# Patient Record
Sex: Female | Born: 1995 | Race: Black or African American | Hispanic: No | Marital: Single | Smoking: Former smoker
Health system: Southern US, Community
[De-identification: ages and names within clinical notes are randomized; demographics above are authoritative.]

## PROBLEM LIST (undated history)

## (undated) DIAGNOSIS — Z789 Other specified health status: Secondary | ICD-10-CM

## (undated) HISTORY — DX: Other specified health status: Z78.9

## (undated) HISTORY — PX: TOOTH EXTRACTION: SUR596

## (undated) HISTORY — PX: NO PAST SURGERIES: SHX2092

---
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
## 2016-08-22 ENCOUNTER — Emergency Department (HOSPITAL_COMMUNITY)
Admission: EM | Admit: 2016-08-22 | Discharge: 2016-08-22 | Disposition: A | Discharge status: Home / Self Care | Payer: Medicaid Other | Attending: Emergency Medicine | Admitting: Emergency Medicine

## 2016-08-22 ENCOUNTER — Encounter (HOSPITAL_COMMUNITY): Payer: Self-pay | Admitting: Emergency Medicine

## 2016-08-22 DIAGNOSIS — F17200 Nicotine dependence, unspecified, uncomplicated: Secondary | ICD-10-CM | POA: Insufficient documentation

## 2016-08-22 DIAGNOSIS — B3731 Acute candidiasis of vulva and vagina: Secondary | ICD-10-CM

## 2016-08-22 DIAGNOSIS — B373 Candidiasis of vulva and vagina: Secondary | ICD-10-CM | POA: Insufficient documentation

## 2016-08-22 DIAGNOSIS — Z79899 Other long term (current) drug therapy: Secondary | ICD-10-CM | POA: Insufficient documentation

## 2016-08-22 LAB — URINALYSIS, ROUTINE W REFLEX MICROSCOPIC
BILIRUBIN URINE: NEGATIVE
GLUCOSE, UA: NEGATIVE mg/dL
HGB URINE DIPSTICK: NEGATIVE
KETONES UR: NEGATIVE mg/dL
Nitrite: NEGATIVE
PH: 6 (ref 5.0–8.0)
Protein, ur: NEGATIVE mg/dL
SPECIFIC GRAVITY, URINE: 1.022 (ref 1.005–1.030)

## 2016-08-22 LAB — URINE MICROSCOPIC-ADD ON

## 2016-08-22 LAB — WET PREP, GENITAL
Clue Cells Wet Prep HPF POC: NONE SEEN
SPERM: NONE SEEN
TRICH WET PREP: NONE SEEN

## 2016-08-22 LAB — POC URINE PREG, ED: PREG TEST UR: NEGATIVE

## 2016-08-22 MED ORDER — FLUCONAZOLE 150 MG PO TABS: 150.0000 mg | ORAL_TABLET | Freq: Once | ORAL | 1 refills | Status: AC

## 2016-08-22 NOTE — ED Provider Notes (Signed)
[facility]-EMERGENCY DEPT Provider Note   CSN: [id number] Arrival date & time: 08/22/16  1840     History   Chief Complaint Chief Complaint  Patient presents with  . Vaginal Itching    HPI Leah Rivera is a 20 y.o. female.  The history is provided by the patient. No language interpreter was used.  Vaginal Itching     Leah Rivera is a 20 y.o. female who presents to the Emergency Department complaining of vaginal itching.  She reports one day of increased vaginal itching and slight increased vaginal discharge. She denies any dysuria or abdominal pain. No fevers, nausea, vomiting. No new sexual partners and no recent similar symptoms.  History reviewed. No pertinent past medical history.  Patient Active Problem List   Diagnosis Date Noted  . Vaginal discharge 02/02/2014  . Encounter for counseling regarding contraception 02/02/2014    History reviewed. No pertinent surgical history.  OB History    No data available       Home Medications    Prior to Admission medications   Medication Sig Start Date End Date Taking? Authorizing Provider  azithromycin (ZITHROMAX) 500 MG tablet Take 2 tablets (1,000 mg total) by mouth daily. 02/04/14   Reuben Likesavid C Keller, MD  hydrocortisone cream 0.5 % Apply 1 application topically 2 (two) times daily.    Historical Provider, MD  metroNIDAZOLE (FLAGYL) 500 MG tablet Take 1 tablet (500 mg total) by mouth 2 (two) times daily. 02/04/14   Reuben Likesavid C Keller, MD  norgestimate-ethinyl estradiol (SPRINTEC 28) 0.25-35 MG-MCG tablet Take 1 tablet by mouth daily. 02/02/14   Garnetta BuddyEdward V Williamson, MD  permethrin (ELIMITE) 5 % cream Apply to affected area once and leave on for 8-10 days then wash off.  Repeat in 7-10 days qs 02/19/13   Marcellina Millinimothy Galey, MD    Family History No family history on file.  Social History Social History  Substance Use Topics  . Smoking status: Current Every Day Smoker    Packs/day: 2.00  . Smokeless tobacco: Never Used  . Alcohol use  Yes     Allergies   Patient has no known allergies.   Review of Systems Review of Systems  All other systems reviewed and are negative.    Physical Exam Updated Vital Signs BP 122/67 (BP Location: Left Arm)   Pulse 64   Temp 98.6 F (37 C) (Oral)   Resp 19   SpO2 99%   Physical Exam  Constitutional: She is oriented to person, place, and time. She appears well-developed and well-nourished.  HENT:  Head: Normocephalic and atraumatic.  Cardiovascular: Normal rate and regular rhythm.   Pulmonary/Chest: Effort normal. No respiratory distress.  Genitourinary:  Genitourinary Comments: Moderate, thick white curd-like discharge. No CMT or adnexal tenderness. Os closed.  Musculoskeletal: Normal range of motion.  Neurological: She is alert and oriented to person, place, and time.  Skin: Skin is warm.  Psychiatric: She has a normal mood and affect.  Nursing note and vitals reviewed.    ED Treatments / Results  Labs (all labs ordered are listed, but only abnormal results are displayed) Labs Reviewed  WET PREP, GENITAL - Abnormal; Notable for the following:       Result Value   Yeast Wet Prep HPF POC PRESENT (*)    WBC, Wet Prep HPF POC FEW (*)    All other components within normal limits  URINALYSIS, ROUTINE W REFLEX MICROSCOPIC (NOT AT [facility]) - Abnormal; Notable for the following:    APPearance CLOUDY (*)  Leukocytes, UA MODERATE (*)    All other components within normal limits  URINE MICROSCOPIC-ADD ON - Abnormal; Notable for the following:    Squamous Epithelial / LPF 6-30 (*)    Bacteria, UA FEW (*)    All other components within normal limits  RPR  HIV ANTIBODY (ROUTINE TESTING)  POC URINE PREG, ED  GC/CHLAMYDIA PROBE AMP ([place]) NOT AT [facility]    EKG  EKG Interpretation None       Radiology No results found.  Procedures Procedures (including critical care time)  Medications Ordered in ED Medications - No data to display   Initial Impression  / Assessment and Plan / ED Course  I have reviewed the triage vital signs and the nursing notes.  Pertinent labs & imaging results that were available during my care of the patient were reviewed by me and considered in my medical decision making (see chart for details).  Clinical Course     Patient here for vaginal itching. Examination is consistent with vulvovaginal candidiasis. Will treat with Diflucan. Exam is not consistent with PID or cervicitis. Discussed him care for yeast infection, outpatient follow-up and return precautions.  Final Clinical Impressions(s) / ED Diagnoses   Final diagnoses:  Vaginal yeast infection    New Prescriptions Discharge Medication List as of 08/22/2016  9:33 PM    START taking these medications   Details  fluconazole (DIFLUCAN) 150 MG tablet Take 1 tablet (150 mg total) by mouth once. Take one tablet.  You may take a second tablet in 72 hours if you have continued symptoms., Starting Sun 08/22/2016, Print         Tilden FossaElizabeth Bibiana Gillean, MD 08/23/16 [phone]

## 2016-08-22 NOTE — ED Notes (Signed)
Patient denies pain and is resting comfortably.  

## 2016-08-22 NOTE — ED Triage Notes (Signed)
Pt has changed body soaps a few days ago, itching started at that time.

## 2016-08-22 NOTE — ED Notes (Signed)
Pelvic supplies at bedside. 

## 2016-08-22 NOTE — ED Triage Notes (Signed)
Patient c/o vaginal itching that started yesterday. patient denies discharge, odors or urination problems.

## 2016-08-23 LAB — GC/CHLAMYDIA PROBE AMP (~~LOC~~) NOT AT ARMC
Chlamydia: NEGATIVE
NEISSERIA GONORRHEA: NEGATIVE

## 2016-08-24 LAB — RPR: RPR Ser Ql: NONREACTIVE

## 2016-08-24 LAB — HIV ANTIBODY (ROUTINE TESTING W REFLEX): HIV Screen 4th Generation wRfx: NONREACTIVE

## 2016-09-16 ENCOUNTER — Encounter (HOSPITAL_COMMUNITY): Payer: Self-pay | Admitting: Emergency Medicine

## 2016-09-16 ENCOUNTER — Emergency Department (HOSPITAL_COMMUNITY): Payer: Self-pay

## 2016-09-16 ENCOUNTER — Emergency Department (HOSPITAL_COMMUNITY)
Admission: EM | Admit: 2016-09-16 | Discharge: 2016-09-17 | Disposition: A | Discharge status: Home / Self Care | Payer: Self-pay | Attending: Emergency Medicine | Admitting: Emergency Medicine

## 2016-09-16 DIAGNOSIS — R0789 Other chest pain: Secondary | ICD-10-CM | POA: Insufficient documentation

## 2016-09-16 DIAGNOSIS — F17200 Nicotine dependence, unspecified, uncomplicated: Secondary | ICD-10-CM | POA: Insufficient documentation

## 2016-09-16 LAB — CBC
HCT: 33.9 % — ABNORMAL LOW (ref 36.0–46.0)
Hemoglobin: 11 g/dL — ABNORMAL LOW (ref 12.0–15.0)
MCH: 27 pg (ref 26.0–34.0)
MCHC: 32.4 g/dL (ref 30.0–36.0)
MCV: 83.1 fL (ref 78.0–100.0)
PLATELETS: 223 10*3/uL (ref 150–400)
RBC: 4.08 MIL/uL (ref 3.87–5.11)
RDW: 12.9 % (ref 11.5–15.5)
WBC: 5.2 10*3/uL (ref 4.0–10.5)

## 2016-09-16 LAB — BASIC METABOLIC PANEL
Anion gap: 8 (ref 5–15)
BUN: 10 mg/dL (ref 6–20)
CALCIUM: 9.6 mg/dL (ref 8.9–10.3)
CO2: 24 mmol/L (ref 22–32)
CREATININE: 0.72 mg/dL (ref 0.44–1.00)
Chloride: 108 mmol/L (ref 101–111)
GFR calc Af Amer: >60 mL/min (ref >60)
GLUCOSE: 92 mg/dL (ref 65–99)
Potassium: 3.4 mmol/L — ABNORMAL LOW (ref 3.5–5.1)
Sodium: 140 mmol/L (ref 135–145)

## 2016-09-16 LAB — I-STAT TROPONIN, ED: TROPONIN I, POC: 0 ng/mL (ref 0.00–0.08)

## 2016-09-16 MED ORDER — KETOROLAC TROMETHAMINE 30 MG/ML IJ SOLN
15.0000 mg | Freq: Once | INTRAMUSCULAR | Status: AC
  Administered 2016-09-16: 15 mg via INTRAMUSCULAR
  Filled 2016-09-16: qty 1

## 2016-09-16 MED ORDER — GI COCKTAIL ~~LOC~~
30.0000 mL | Freq: Once | ORAL | Status: AC
  Administered 2016-09-16: 30 mL via ORAL
  Filled 2016-09-16: qty 30

## 2016-09-16 MED ORDER — PREDNISONE 20 MG PO TABS
60.0000 mg | ORAL_TABLET | ORAL | Status: AC
  Administered 2016-09-16: 60 mg via ORAL
  Filled 2016-09-16: qty 3

## 2016-09-16 MED ORDER — OMEPRAZOLE 20 MG PO CPDR: 20.0000 mg | DELAYED_RELEASE_CAPSULE | Freq: Every day | ORAL | 0 refills | Status: DC

## 2016-09-16 MED ORDER — GI COCKTAIL ~~LOC~~: 30.0000 mL | Freq: Two times a day (BID) | ORAL | 0 refills | Status: AC | PRN

## 2016-09-16 NOTE — ED Provider Notes (Signed)
[facility]-EMERGENCY DEPT Provider Note   CSN: [id number] Arrival date & time: 09/16/16  2000     History   Chief Complaint Chief Complaint  Patient presents with  . Chest Pain    HPI Leah Rivera is a 20 y.o. female.  HPI Patient presents with chest pain. The pain began earlier today, was mild, but over the past 3 hours has become severe. The pain is focally in the sternum, throat, burning, sharp. There is associated nausea, but no vomiting. No dyspnea. Patient is generally well, denies medical problems, but does state that she has muscle soreness for which she has been taking between 600 and 1200 mg of ibuprofen daily for the past few days. Since this episode of pain began no medication taken for relief, no clear alleviating or exacerbating factors.    History reviewed. No pertinent past medical history.  Patient Active Problem List   Diagnosis Date Noted  . Vaginal discharge 02/02/2014  . Encounter for counseling regarding contraception 02/02/2014    History reviewed. No pertinent surgical history.  OB History    No data available       Home Medications    Prior to Admission medications   Medication Sig Start Date End Date Taking? Authorizing Provider  ibuprofen (ADVIL,MOTRIN) 600 MG tablet Take 600 mg by mouth every 6 (six) hours as needed for moderate pain.   Yes Historical Provider, MD  azithromycin (ZITHROMAX) 500 MG tablet Take 2 tablets (1,000 mg total) by mouth daily. Patient not taking: Reported on 09/16/2016 02/04/14   Reuben Likesavid C Keller, MD  metroNIDAZOLE (FLAGYL) 500 MG tablet Take 1 tablet (500 mg total) by mouth 2 (two) times daily. Patient not taking: Reported on 09/16/2016 02/04/14   Reuben Likesavid C Keller, MD  norgestimate-ethinyl estradiol (SPRINTEC 28) 0.25-35 MG-MCG tablet Take 1 tablet by mouth daily. Patient not taking: Reported on 09/16/2016 02/02/14   Garnetta BuddyEdward V Williamson, MD  permethrin (ELIMITE) 5 % cream Apply to affected area once and leave on for  8-10 days then wash off.  Repeat in 7-10 days qs Patient not taking: Reported on 09/16/2016 02/19/13   Marcellina Millinimothy Galey, MD    Family History History reviewed. No pertinent family history.  Social History Social History  Substance Use Topics  . Smoking status: Current Every Day Smoker    Packs/day: 2.00  . Smokeless tobacco: Never Used  . Alcohol use Yes     Allergies   Patient has no known allergies.   Review of Systems Review of Systems  Constitutional:       Per HPI, otherwise negative  HENT:       Per HPI, otherwise negative  Respiratory:       Per HPI, otherwise negative  Cardiovascular:       Per HPI, otherwise negative  Gastrointestinal: Positive for nausea. Negative for vomiting.  Endocrine:       Negative aside from HPI  Genitourinary:       Neg aside from HPI   Musculoskeletal:       Per HPI, otherwise negative  Skin: Negative.   Neurological: Negative for syncope.     Physical Exam Updated Vital Signs BP 146/83 (BP Location: Right Leg)   Pulse 69   Temp 99.2 F (37.3 C) (Oral)   Resp 18   Ht 5\' 9"  (1.753 m)   Wt 154 lb (69.9 kg)   LMP 09/16/2014   SpO2 100%   BMI 22.74 kg/m   Physical Exam  Constitutional: She is oriented to  person, place, and time. She appears well-developed and well-nourished. No distress.  HENT:  Head: Normocephalic and atraumatic.  Nose: Nose normal.  Mouth/Throat: Oropharynx is clear and moist. No oropharyngeal exudate.  Eyes: Conjunctivae and EOM are normal.  Cardiovascular: Normal rate and regular rhythm.   Pulmonary/Chest: Effort normal and breath sounds normal. No stridor. No respiratory distress.  Abdominal: She exhibits no distension.  Musculoskeletal: She exhibits no edema.  Neurological: She is alert and oriented to person, place, and time. No cranial nerve deficit.  Skin: Skin is warm and dry.  Psychiatric: She has a normal mood and affect.  Nursing note and vitals reviewed.    ED Treatments / Results    Labs (all labs ordered are listed, but only abnormal results are displayed) Labs Reviewed  BASIC METABOLIC PANEL - Abnormal; Notable for the following:       Result Value   Potassium 3.4 (*)    All other components within normal limits  CBC - Abnormal; Notable for the following:    Hemoglobin 11.0 (*)    HCT 33.9 (*)    All other components within normal limits  I-STAT TROPOININ, ED    EKG  EKG Interpretation  Date/Time:  Thursday September 16 2016 20:36:11 EST Ventricular Rate:  112 PR Interval:    QRS Duration: 67 QT Interval:  333 QTC Calculation: 451 R Axis:   75 Text Interpretation:  Sinus tachycardia Baseline wander in lead(s) II III aVF V3 Artifact Abnormal ekg Confirmed by Gerhard MunchLOCKWOOD, Kazzandra Desaulniers  MD ([id number]) on 09/16/2016 10:28:41 PM       Radiology Dg Chest 2 View  Result Date: 09/16/2016 CLINICAL DATA:  Left sided chest pain x 1 hour. No cardiac hx. Smokes Marijuana daily. Last use was 9 am today EXAM: CHEST  2 VIEW COMPARISON:  None. FINDINGS: The heart size and mediastinal contours are within normal limits. Both lungs are clear. No pleural effusion or pneumothorax. The visualized skeletal structures are unremarkable. IMPRESSION: Normal chest radiographs. Electronically Signed   By: Amie Portlandavid  Ormond M.D.   On: 09/16/2016 20:39    Procedures Procedures (including critical care time)  Medications Ordered in ED Medications  ketorolac (TORADOL) 30 MG/ML injection 15 mg (15 mg Intramuscular Given 09/16/16 2300)  predniSONE (DELTASONE) tablet 60 mg (60 mg Oral Given 09/16/16 2253)  gi cocktail (Maalox,Lidocaine,Donnatal) (30 mLs Oral Given 09/16/16 2253)     Initial Impression / Assessment and Plan / ED Course  I have reviewed the triage vital signs and the nursing notes.  Pertinent labs & imaging results that were available during my care of the patient were reviewed by me and considered in my medical decision making (see chart for details).  Clinical Course     On  repeat exam the patient has no ongoing complaints, chest pain has resolved. I discussed all findings with her and her companion. We discussed importance of following up with GI as needed. Given the reassuring labs, description of symptoms along the sternum, and recent use of substantial ibuprofen dosage with her some suspicion for gastroesophageal etiology. Absent risk factors for coronary disease, low suspicion for ACS or other etiology. No evidence for infectious pathology.   Final Clinical Impressions(s) / ED Diagnoses  Atypical chest pain   Gerhard Munchobert Zachery Niswander, MD 09/16/16 2350

## 2016-09-16 NOTE — ED Triage Notes (Signed)
Pt states she was sitting on the couch about an hour ago and started having a sharp pain at the bottom of her throat in her upper chest  Pt states the pain is worse when she sits up straight or takes a deep breath  Pt is doubled over in wheelchair in triage

## 2017-02-26 ENCOUNTER — Ambulatory Visit (HOSPITAL_COMMUNITY)
Admission: EM | Admit: 2017-02-26 | Discharge: 2017-02-26 | Disposition: A | Discharge status: Home / Self Care | Payer: Self-pay | Attending: Internal Medicine | Admitting: Internal Medicine

## 2017-02-26 ENCOUNTER — Encounter (HOSPITAL_COMMUNITY): Payer: Self-pay | Admitting: Family Medicine

## 2017-02-26 DIAGNOSIS — N898 Other specified noninflammatory disorders of vagina: Secondary | ICD-10-CM | POA: Insufficient documentation

## 2017-02-26 DIAGNOSIS — F17210 Nicotine dependence, cigarettes, uncomplicated: Secondary | ICD-10-CM | POA: Insufficient documentation

## 2017-02-26 DIAGNOSIS — B373 Candidiasis of vulva and vagina: Secondary | ICD-10-CM | POA: Insufficient documentation

## 2017-02-26 DIAGNOSIS — B3731 Acute candidiasis of vulva and vagina: Secondary | ICD-10-CM

## 2017-02-26 DIAGNOSIS — Z202 Contact with and (suspected) exposure to infections with a predominantly sexual mode of transmission: Secondary | ICD-10-CM

## 2017-02-26 MED ORDER — LIDOCAINE HCL (PF) 1 % IJ SOLN
INTRAMUSCULAR | Status: AC
  Filled 2017-02-26: qty 2

## 2017-02-26 MED ORDER — AZITHROMYCIN 250 MG PO TABS
1000.0000 mg | ORAL_TABLET | Freq: Once | ORAL | Status: AC
  Administered 2017-02-26: 1000 mg via ORAL

## 2017-02-26 MED ORDER — CEFTRIAXONE SODIUM 250 MG IJ SOLR
INTRAMUSCULAR | Status: AC
  Filled 2017-02-26: qty 250

## 2017-02-26 MED ORDER — FLUCONAZOLE 200 MG PO TABS: 200.0000 mg | ORAL_TABLET | Freq: Once | ORAL | 0 refills | Status: AC

## 2017-02-26 MED ORDER — CEFTRIAXONE SODIUM 250 MG IJ SOLR
250.0000 mg | Freq: Once | INTRAMUSCULAR | Status: AC
  Administered 2017-02-26: 250 mg via INTRAMUSCULAR

## 2017-02-26 MED ORDER — AZITHROMYCIN 250 MG PO TABS
ORAL_TABLET | ORAL | Status: AC
  Filled 2017-02-26: qty 4

## 2017-02-26 MED ORDER — AZITHROMYCIN 1 G PO PACK: 1.0000 g | PACK | Freq: Once | ORAL | Status: DC

## 2017-02-26 NOTE — ED Triage Notes (Signed)
Pt here for STD check. sts her boyfriend told her that he had STD symptoms. Sts she thisnks that she has a yeast infection.

## 2017-02-26 NOTE — ED Notes (Signed)
Call back number verified and updated in EPIC... Adv pt to not have SI until lab results comeback neg.... Also adv pt lab results will be on MyChart; instructions given .... Pt verb understanding.   

## 2017-02-26 NOTE — ED Provider Notes (Signed)
CSN: [id number]     Arrival date & time 02/26/17  1209 History   First MD Initiated Contact with Patient 02/26/17 1325     Chief Complaint  Patient presents with  . Exposure to STD   (Consider location/radiation/quality/duration/timing/severity/associated sxs/prior Treatment) HPI  Leah Christell ConstantMoore is a 21 y.o. female presenting to UC with c/o vaginal discharge and mild itching that started about 2 days ago. Discharge is thick and white. She believes it is a yeast infection so she tried OTC generic yeast infection medication w/o relief.  She notes her boyfriend is here today as well to be evaluated for penile discharge. Pt would like to be tested and empirically treated for STIs.  Denies fever, chills, n/v/d. No abdominal pain or back pain. Hx of yeast infections before. No recent antibiotic use. She has had unprotected intercourse with her boyfriend. She is on birth control. She has not had her menstrual cycle for about 2 years since being on birth control.    History reviewed. No pertinent past medical history. History reviewed. No pertinent surgical history. History reviewed. No pertinent family history. Social History  Substance Use Topics  . Smoking status: Current Every Day Smoker    Packs/day: 2.00  . Smokeless tobacco: Never Used  . Alcohol use Yes   OB History    No data available     Review of Systems  Constitutional: Negative for chills and fever.  Gastrointestinal: Negative for abdominal pain, diarrhea, nausea and vomiting.  Genitourinary: Positive for vaginal discharge and vaginal pain (burning and irritation). Negative for decreased urine volume, dysuria, frequency, hematuria, pelvic pain, urgency and vaginal bleeding.    Allergies  Patient has no known allergies.  Home Medications   Prior to Admission medications   Medication Sig Start Date End Date Taking? Authorizing Provider  azithromycin (ZITHROMAX) 500 MG tablet Take 2 tablets (1,000 mg total) by mouth  daily. Patient not taking: Reported on 09/16/2016 02/04/14   Reuben LikesKeller, David C, MD  fluconazole (DIFLUCAN) 200 MG tablet Take 1 tablet (200 mg total) by mouth once. May repeat in 3 days if still having symptoms. 02/26/17 02/26/17  Junius Finner'Malley, Versie Soave, PA-C  ibuprofen (ADVIL,MOTRIN) 600 MG tablet Take 600 mg by mouth every 6 (six) hours as needed for moderate pain.    [provider]  metroNIDAZOLE (FLAGYL) 500 MG tablet Take 1 tablet (500 mg total) by mouth 2 (two) times daily. Patient not taking: Reported on 09/16/2016 02/04/14   Reuben LikesKeller, David C, MD  norgestimate-ethinyl estradiol (SPRINTEC 28) 0.25-35 MG-MCG tablet Take 1 tablet by mouth daily. Patient not taking: Reported on 09/16/2016 02/02/14   Garnetta BuddyWilliamson, Edward V, MD  omeprazole (PRILOSEC) 20 MG capsule Take 1 capsule (20 mg total) by mouth daily. Take one tablet daily 09/16/16   Gerhard MunchLockwood, Robert, MD  permethrin (ELIMITE) 5 % cream Apply to affected area once and leave on for 8-10 days then wash off.  Repeat in 7-10 days qs Patient not taking: Reported on 09/16/2016 02/19/13   Marcellina MillinGaley, Timothy, MD   Meds Ordered and Administered this Visit   Medications  cefTRIAXone (ROCEPHIN) injection 250 mg (250 mg Intramuscular Given 02/26/17 1409)  azithromycin (ZITHROMAX) tablet 1,000 mg (1,000 mg Oral Given 02/26/17 1409)    BP (!) 109/55 (BP Location: Right Arm) Comment: notified rn  Pulse 63   Temp 98.8 F (37.1 C) (Oral)   Resp 14   SpO2 100%  No data found.   Physical Exam  Constitutional: She is oriented to person, place, and  time. She appears well-developed and well-nourished.  HENT:  Head: Normocephalic and atraumatic.  Eyes: EOM are normal.  Neck: Normal range of motion.  Cardiovascular: Normal rate and regular rhythm.   Pulmonary/Chest: Effort normal and breath sounds normal. No respiratory distress.  Abdominal: Soft. She exhibits no distension and no mass. There is no tenderness. There is no rebound, no guarding and no CVA tenderness.   Genitourinary:  Genitourinary Comments: Chaperoned exam. Normal external exam. Vaginal canal: moderate amount of thick white discharge, blood tinged. No CMT, adnexal tenderness or masses.   Musculoskeletal: Normal range of motion.  Neurological: She is alert and oriented to person, place, and time.  Skin: Skin is warm and dry.  Psychiatric: She has a normal mood and affect. Her behavior is normal.  Nursing note and vitals reviewed.   Urgent Care Course     Procedures (including critical care time)  Labs Review Labs Reviewed  CERVICOVAGINAL ANCILLARY ONLY    Imaging Review No results found.   MDM   1. Vaginal discharge   2. Possible exposure to STD   3. Vaginal yeast infection    Exam c/w vaginal yeast infection. Pt also treated empirically for gonorrhea and chlamydia based on reported symptoms of boyfriend.  Tx in UC: Rocephin and Azithromycin   Rx: diflucan F/u with PCP or OB/GYN in 1 week if not improving.  Home care instructions including pt info on safe sex provided.    Junius Finner, PA-C 02/26/17 1505

## 2017-02-26 NOTE — Discharge Instructions (Signed)
° °  Your exam appears consistent with a vaginal yeast infection.  You have been given treatment for possible gonorrhea and chlamydia in urgent care.  If your tests come back positive for Trichomonas, you will be called in the appropriate medication to your pharmacy.  Refrain from sexual intercourse for 7 days. Be sure to have all partners tested and treated for STDs.  Practice safe sex by always wearing condoms.

## 2017-02-28 LAB — CERVICOVAGINAL ANCILLARY ONLY
Bacterial vaginitis: NEGATIVE
Candida vaginitis: POSITIVE — AB
Chlamydia: NEGATIVE
Neisseria Gonorrhea: POSITIVE — AB
Trichomonas: NEGATIVE

## 2017-11-16 ENCOUNTER — Emergency Department (HOSPITAL_COMMUNITY)
Admission: EM | Admit: 2017-11-16 | Discharge: 2017-11-16 | Disposition: A | Discharge status: Home / Self Care | Payer: Self-pay | Attending: Emergency Medicine | Admitting: Emergency Medicine

## 2017-11-16 ENCOUNTER — Other Ambulatory Visit: Payer: Self-pay

## 2017-11-16 ENCOUNTER — Encounter (HOSPITAL_COMMUNITY): Payer: Self-pay

## 2017-11-16 DIAGNOSIS — Z79899 Other long term (current) drug therapy: Secondary | ICD-10-CM | POA: Insufficient documentation

## 2017-11-16 DIAGNOSIS — K047 Periapical abscess without sinus: Secondary | ICD-10-CM | POA: Insufficient documentation

## 2017-11-16 DIAGNOSIS — F17200 Nicotine dependence, unspecified, uncomplicated: Secondary | ICD-10-CM | POA: Insufficient documentation

## 2017-11-16 MED ORDER — PENICILLIN V POTASSIUM 500 MG PO TABS: 500.0000 mg | ORAL_TABLET | Freq: Four times a day (QID) | ORAL | 0 refills | Status: AC

## 2017-11-16 MED ORDER — LIDOCAINE VISCOUS 2 % MT SOLN: 20.0000 mL | OROMUCOSAL | 0 refills | Status: DC | PRN

## 2017-11-16 MED ORDER — IBUPROFEN 800 MG PO TABS: 800.0000 mg | ORAL_TABLET | Freq: Three times a day (TID) | ORAL | 0 refills | Status: DC

## 2017-11-16 NOTE — Discharge Instructions (Signed)
Take antibiotics as prescribed.  Take the entire course of antibiotics, even if your symptoms improve. Take ibuprofen 3 times a day with meals.  Do not take other anti-inflammatories at the same time open (Advil, Motrin, naproxen, Aleve). You may supplement with Tylenol if you need further pain control. Use lidocaine liquid as needed for pain.  It is very important that you follow-up with a dentist.  There is information about dentists in the area included in this paperwork. Return to the emergency room if you are developing fevers, difficulty opening your mouth, difficulty breathing, or any new or concerning symptoms.

## 2017-11-16 NOTE — ED Triage Notes (Signed)
Pt is alert and oriented x 4 and is verbally responsive. Pt has a abscess/swelling noted to the rt lower most posterior gum as well as to the rt side lateral gum line. Pt reports that pain began yesterday. Pt denies taking any medication for pain. Pt has some mild rt side facial  Swelling.

## 2017-11-16 NOTE — ED Provider Notes (Signed)
[facility]-EMERGENCY DEPT Provider Note   CSN: [id number] Arrival date & time: 11/16/17  1438     History   Chief Complaint Chief Complaint  Patient presents with  . Dental Pain    HPI Leah Rivera is a 22 y.o. female presenting for evaluation of dental pain.  Patient states that this morning she developed right lower dental pain.  Pain is located behind her last molar.  She states that she was supposed to get her wisdom teeth extracted due to there not being space in her mouth, but she never did.  She has not tried anything for pain including Tylenol or ibuprofen.  Pain is constant, worse with eating. Nothing makes it better. Additionally, patient reports a spot on her outer gum on the right lower side which has been there for several months, sometimes swollen sometimes not.  She denies pain at this time.  She denies drainage from this area.  She denies fevers, chills, difficulty swallowing, trismus, chest pain, shortness of breath.  She is not immunocompromised.  She has no other medical problems, does not take medications daily. She denies h/o dental problems.  HPI  No past medical history on file.  Patient Active Problem List   Diagnosis Date Noted  . Vaginal discharge 02/02/2014  . Encounter for counseling regarding contraception 02/02/2014    No past surgical history on file.  OB History    No data available       Home Medications    Prior to Admission medications   Medication Sig Start Date End Date Taking? Authorizing Provider  azithromycin (ZITHROMAX) 500 MG tablet Take 2 tablets (1,000 mg total) by mouth daily. Patient not taking: Reported on 09/16/2016 02/04/14   Reuben LikesKeller, David C, MD  ibuprofen (ADVIL,MOTRIN) 800 MG tablet Take 1 tablet (800 mg total) by mouth 3 (three) times daily. 11/16/17   Shanequia Kendrick, PA-C  lidocaine (XYLOCAINE) 2 % solution Use as directed 20 mLs in the mouth or throat as needed for mouth pain. 11/16/17    Ailed Defibaugh, PA-C  metroNIDAZOLE (FLAGYL) 500 MG tablet Take 1 tablet (500 mg total) by mouth 2 (two) times daily. Patient not taking: Reported on 09/16/2016 02/04/14   Reuben LikesKeller, David C, MD  norgestimate-ethinyl estradiol (SPRINTEC 28) 0.25-35 MG-MCG tablet Take 1 tablet by mouth daily. Patient not taking: Reported on 09/16/2016 02/02/14   Garnetta BuddyWilliamson, Edward V, MD  omeprazole (PRILOSEC) 20 MG capsule Take 1 capsule (20 mg total) by mouth daily. Take one tablet daily 09/16/16   Gerhard MunchLockwood, Robert, MD  penicillin v potassium (VEETID) 500 MG tablet Take 1 tablet (500 mg total) by mouth 4 (four) times daily for 7 days. 11/16/17 11/23/17  Jesusita Jocelyn, PA-C  permethrin (ELIMITE) 5 % cream Apply to affected area once and leave on for 8-10 days then wash off.  Repeat in 7-10 days qs Patient not taking: Reported on 09/16/2016 02/19/13   Marcellina MillinGaley, Timothy, MD    Family History No family history on file.  Social History Social History   Tobacco Use  . Smoking status: Current Every Day Smoker    Packs/day: 2.00  . Smokeless tobacco: Never Used  Substance Use Topics  . Alcohol use: Yes  . Drug use: Yes    Types: Marijuana     Allergies   Patient has no known allergies.   Review of Systems Review of Systems  Constitutional: Negative for chills and fever.  HENT: Positive for dental problem.      Physical Exam  Updated Vital Signs BP 120/71 (BP Location: Right Arm)   Pulse 66   Temp 98.5 F (36.9 C) (Oral)   Resp 16   Ht 5\' 8"  (1.727 m)   Wt 66.7 kg (147 lb)   LMP 10/20/2017   SpO2 100%   BMI 22.35 kg/m   Physical Exam  Constitutional: She is oriented to person, place, and time. She appears well-developed and well-nourished. No distress.  HENT:  Head: Normocephalic and atraumatic.  Nose: Nose normal.  Mouth/Throat: Uvula is midline, oropharynx is clear and moist and mucous membranes are normal. No oral lesions. No trismus in the jaw. Dental abscesses present. No uvula swelling  or dental caries.    2 areas in the mouth that are soft, fluctuant, and tender.  No drainage.  No tenderness palpation of the teeth.  No obvious dental caries.  No swelling extending into the cheek or neck.  No pain under the tongue.  Uvula midline with equal palate rise.  No trismus.  Handling secretions easily.  Eyes: EOM are normal.  Neck: Normal range of motion.  Pulmonary/Chest: Effort normal.  Abdominal: She exhibits no distension.  Musculoskeletal: Normal range of motion.  Neurological: She is alert and oriented to person, place, and time.  Skin: Skin is warm. No rash noted.  Psychiatric: She has a normal mood and affect.  Nursing note and vitals reviewed.    ED Treatments / Results  Labs (all labs ordered are listed, but only abnormal results are displayed) Labs Reviewed - No data to display  EKG  EKG Interpretation None       Radiology No results found.  Procedures Procedures (including critical care time)  Medications Ordered in ED Medications - No data to display   Initial Impression / Assessment and Plan / ED Course  I have reviewed the triage vital signs and the nursing notes.  Pertinent labs & imaging results that were available during my care of the patient were reviewed by me and considered in my medical decision making (see chart for details).     Pt presenting for evaluation of dental pain. Physical exam shows a pt who is afebrile and appears nontoxic. Doubt systemic infection. 2 small dental abscesses. No sign of ludwigs. Offered I and D, but pt declined. Pt given abx, NSAIDs, and viscous lidocaine for infection and pain. Stressed importance of f/u with dentistry, and resource guide given. Pt aware abscesses may not respond to abx. At this time, pt appears safe for d/c. Return precautions given. Pt states she understands and agrees to plan.   Final Clinical Impressions(s) / ED Diagnoses   Final diagnoses:  Dental infection    ED Discharge Orders         Ordered    penicillin v potassium (VEETID) 500 MG tablet  4 times daily     11/16/17 1539    ibuprofen (ADVIL,MOTRIN) 800 MG tablet  3 times daily     11/16/17 1539    lidocaine (XYLOCAINE) 2 % solution  As needed     11/16/17 1539       Muadh Creasy, PA-C 11/16/17 1816    Terrilee Files, MD 11/17/17 [phone]

## 2018-04-13 ENCOUNTER — Ambulatory Visit (HOSPITAL_COMMUNITY)
Admission: EM | Admit: 2018-04-13 | Discharge: 2018-04-13 | Disposition: A | Discharge status: Home / Self Care | Payer: Medicaid Other

## 2018-04-13 ENCOUNTER — Encounter (HOSPITAL_COMMUNITY): Payer: Self-pay

## 2018-04-13 ENCOUNTER — Other Ambulatory Visit: Payer: Self-pay

## 2018-04-13 ENCOUNTER — Emergency Department (HOSPITAL_COMMUNITY)
Admission: EM | Admit: 2018-04-13 | Discharge: 2018-04-13 | Disposition: A | Discharge status: Home / Self Care | Payer: Medicaid Other | Attending: Emergency Medicine | Admitting: Emergency Medicine

## 2018-04-13 DIAGNOSIS — Z87891 Personal history of nicotine dependence: Secondary | ICD-10-CM | POA: Insufficient documentation

## 2018-04-13 DIAGNOSIS — K047 Periapical abscess without sinus: Secondary | ICD-10-CM

## 2018-04-13 DIAGNOSIS — Z79899 Other long term (current) drug therapy: Secondary | ICD-10-CM | POA: Insufficient documentation

## 2018-04-13 MED ORDER — AMOXICILLIN 500 MG PO CAPS: 500.0000 mg | ORAL_CAPSULE | Freq: Three times a day (TID) | ORAL | 0 refills | Status: DC

## 2018-04-13 MED ORDER — NAPROXEN 500 MG PO TABS: 500.0000 mg | ORAL_TABLET | Freq: Two times a day (BID) | ORAL | 0 refills | Status: DC

## 2018-04-13 NOTE — Discharge Instructions (Addendum)
Take the medication as directed. See a dentist as soon as possible.

## 2018-04-13 NOTE — ED Provider Notes (Signed)
[facility] EMERGENCY DEPARTMENT Provider Note   CSN: [id number] Arrival date & time: 04/13/18  1222     History   Chief Complaint Chief Complaint  Patient presents with  . Dental Pain    HPI Leah Rivera is a 22 y.o. female who presents to the ED for dental pain. The pain is located in the right lower dental area and started 2 days ago. Patient does not have a dentist. Patient reports having dental abscess in the same area in the past.   HPI  History reviewed. No pertinent past medical history.  Patient Active Problem List   Diagnosis Date Noted  . Vaginal discharge 02/02/2014  . Encounter for counseling regarding contraception 02/02/2014    History reviewed. No pertinent surgical history.   OB History   None      Home Medications    Prior to Admission medications   Medication Sig Start Date End Date Taking? Authorizing Provider  amoxicillin (AMOXIL) 500 MG capsule Take 1 capsule (500 mg total) by mouth 3 (three) times daily. 04/13/18   Janne Napoleon, NP  lidocaine (XYLOCAINE) 2 % solution Use as directed 20 mLs in the mouth or throat as needed for mouth pain. 11/16/17   Caccavale, Sophia, PA-C  naproxen (NAPROSYN) 500 MG tablet Take 1 tablet (500 mg total) by mouth 2 (two) times daily. 04/13/18   Janne Napoleon, NP  norgestimate-ethinyl estradiol (SPRINTEC 28) 0.25-35 MG-MCG tablet Take 1 tablet by mouth daily. Patient not taking: Reported on 09/16/2016 02/02/14   Garnetta Buddy, MD  omeprazole (PRILOSEC) 20 MG capsule Take 1 capsule (20 mg total) by mouth daily. Take one tablet daily 09/16/16   Gerhard Munch, MD    Family History History reviewed. No pertinent family history.  Social History Social History   Tobacco Use  . Smoking status: Former Smoker    Packs/day: 2.00  . Smokeless tobacco: Never Used  Substance Use Topics  . Alcohol use: Yes    Comment: occ  . Drug use: Yes    Types: Marijuana    Comment: every day      Allergies   Patient has no known allergies.   Review of Systems Review of Systems  HENT: Positive for dental problem.   Hematological: Positive for adenopathy.  All other systems reviewed and are negative.    Physical Exam Updated Vital Signs BP 133/80 (BP Location: Right Arm)   Pulse 60   Temp 97.9 F (36.6 C) (Oral)   Resp 16   Ht 5\' 9"  (1.753 m)   Wt 70.3 kg (155 lb)   LMP 04/05/2018 (Exact Date)   SpO2 100%   BMI 22.89 kg/m   Physical Exam  Constitutional: She appears well-developed and well-nourished. No distress.  HENT:  Right Ear: Tympanic membrane normal.  Left Ear: Tympanic membrane normal.  Nose: Nose normal.  Mouth/Throat: Uvula is midline and oropharynx is clear and moist.    Mild facial swelling right jaw area.Swelling of the gum surrounding the first right lower molar. Tender on exam.   Eyes: EOM are normal.  Neck: Neck supple.  Cardiovascular: Normal rate.  Pulmonary/Chest: Effort normal.  Abdominal: Soft. There is no tenderness.  Musculoskeletal: Normal range of motion.  Lymphadenopathy:    She has cervical adenopathy.  Neurological: She is alert.  Skin: Skin is warm and dry.  Psychiatric: She has a normal mood and affect.  Nursing note and vitals reviewed.    ED Treatments / Results  Labs (  all labs ordered are listed, but only abnormal results are displayed) Labs Reviewed - No data to display Procedures Procedures (including critical care time)  Medications Ordered in ED Medications - No data to display   Initial Impression / Assessment and Plan / ED Course  I have reviewed the triage vital signs and the nursing notes. Patient with toothache.  No gross abscess.  Exam unconcerning for Ludwig's angina or spread of infection.  Will treat with amoxicillin and anti-inflammatories medicine.  Urged patient to follow-up with dentist.   Final Clinical Impressions(s) / ED Diagnoses   Final diagnoses:  Dental abscess    ED Discharge  Orders        Ordered    amoxicillin (AMOXIL) 500 MG capsule  3 times daily     04/13/18 1253    naproxen (NAPROSYN) 500 MG tablet  2 times daily     04/13/18 1253       Damian Leavelleese, Hubbard Lake[place] M, [place]NP 04/13/18 1539    Shaune PollackIsaacs, Cameron, MD 04/13/18 2249

## 2018-04-13 NOTE — ED Triage Notes (Signed)
Pt endorses right lower dental pain x 2 days. Does not have a dentist. VSS

## 2018-04-13 NOTE — ED Notes (Signed)
Declined W/C at D/C and was escorted to lobby by RN. 

## 2018-05-22 ENCOUNTER — Encounter (HOSPITAL_COMMUNITY): Payer: Self-pay | Admitting: Emergency Medicine

## 2018-05-22 ENCOUNTER — Emergency Department (HOSPITAL_COMMUNITY)
Admission: EM | Admit: 2018-05-22 | Discharge: 2018-05-22 | Disposition: A | Discharge status: Home / Self Care | Payer: Medicaid Other | Attending: Emergency Medicine | Admitting: Emergency Medicine

## 2018-05-22 DIAGNOSIS — N898 Other specified noninflammatory disorders of vagina: Secondary | ICD-10-CM

## 2018-05-22 DIAGNOSIS — Z79899 Other long term (current) drug therapy: Secondary | ICD-10-CM | POA: Insufficient documentation

## 2018-05-22 DIAGNOSIS — Z87891 Personal history of nicotine dependence: Secondary | ICD-10-CM | POA: Insufficient documentation

## 2018-05-22 LAB — WET PREP, GENITAL
Clue Cells Wet Prep HPF POC: NONE SEEN
Sperm: NONE SEEN
Trich, Wet Prep: NONE SEEN
WBC, Wet Prep HPF POC: NONE SEEN
Yeast Wet Prep HPF POC: NONE SEEN

## 2018-05-22 LAB — URINALYSIS, ROUTINE W REFLEX MICROSCOPIC
BILIRUBIN URINE: NEGATIVE
Glucose, UA: NEGATIVE mg/dL
Hgb urine dipstick: NEGATIVE
Ketones, ur: NEGATIVE mg/dL
LEUKOCYTES UA: NEGATIVE
NITRITE: NEGATIVE
Protein, ur: NEGATIVE mg/dL
SPECIFIC GRAVITY, URINE: 1.015 (ref 1.005–1.030)
pH: 7 (ref 5.0–8.0)

## 2018-05-22 LAB — POC URINE PREG, ED: PREG TEST UR: NEGATIVE

## 2018-05-22 NOTE — ED Notes (Signed)
Pt complains of vaginal discharge for the last couple of days.

## 2018-05-22 NOTE — ED Provider Notes (Signed)
[facility] EMERGENCY DEPARTMENT Provider Note   CSN: [id number] Arrival date & time: 05/22/18  1438     History   Chief Complaint No chief complaint on file.   HPI Leah Rivera is a 22 y.o. female.  HPI  22 year old female presents today with complaints of vaginal discharge 2 days. Patient reports she sexually active with both men and women. She denies any significant abdominal pain fever chills nausea or vomiting. She reports history of bacterial vaginosis in the past.  History reviewed. No pertinent past medical history.  Patient Active Problem List   Diagnosis Date Noted  . Vaginal discharge 02/02/2014  . Encounter for counseling regarding contraception 02/02/2014    History reviewed. No pertinent surgical history.   OB History   None      Home Medications    Prior to Admission medications   Medication Sig Start Date End Date Taking? Authorizing Provider  amoxicillin (AMOXIL) 500 MG capsule Take 1 capsule (500 mg total) by mouth 3 (three) times daily. 04/13/18   Janne Napoleon, NP  lidocaine (XYLOCAINE) 2 % solution Use as directed 20 mLs in the mouth or throat as needed for mouth pain. 11/16/17   Caccavale, Sophia, PA-C  naproxen (NAPROSYN) 500 MG tablet Take 1 tablet (500 mg total) by mouth 2 (two) times daily. 04/13/18   Janne Napoleon, NP  norgestimate-ethinyl estradiol (SPRINTEC 28) 0.25-35 MG-MCG tablet Take 1 tablet by mouth daily. Patient not taking: Reported on 09/16/2016 02/02/14   Garnetta Buddy, MD  omeprazole (PRILOSEC) 20 MG capsule Take 1 capsule (20 mg total) by mouth daily. Take one tablet daily 09/16/16   Gerhard Munch, MD    Family History No family history on file.  Social History Social History   Tobacco Use  . Smoking status: Former Smoker    Packs/day: 2.00  . Smokeless tobacco: Never Used  Substance Use Topics  . Alcohol use: Yes    Comment: occ  . Drug use: Yes    Types: Marijuana    Comment: every day       Allergies   Patient has no known allergies.   Review of Systems Review of Systems  All other systems reviewed and are negative.    Physical Exam Updated Vital Signs BP (!) 132/94 (BP Location: Right Arm)   Pulse 70   Temp 97.9 F (36.6 C) (Oral)   Resp 18   SpO2 100%   Physical Exam  Constitutional: She is oriented to person, place, and time. She appears well-developed and well-nourished.  HENT:  Head: Normocephalic and atraumatic.  Eyes: Pupils are equal, round, and reactive to light. Conjunctivae are normal. Right eye exhibits no discharge. Left eye exhibits no discharge. No scleral icterus.  Neck: Normal range of motion. No JVD present. No tracheal deviation present.  Pulmonary/Chest: Effort normal. No stridor.  Genitourinary:  Genitourinary Comments: Small amount of white vaginal discharge- no cervical motion tenderness   Neurological: She is alert and oriented to person, place, and time. Coordination normal.  Psychiatric: She has a normal mood and affect. Her behavior is normal. Judgment and thought content normal.  Nursing note and vitals reviewed.   ED Treatments / Results  Labs (all labs ordered are listed, but only abnormal results are displayed) Labs Reviewed  WET PREP, GENITAL  URINALYSIS, ROUTINE W REFLEX MICROSCOPIC  POC URINE PREG, ED  GC/CHLAMYDIA PROBE AMP ([place]) NOT AT [facility]    EKG None  Radiology No results found.  Procedures Procedures (including critical care time)  Medications Ordered in ED Medications - No data to display   Initial Impression / Assessment and Plan / ED Course  I have reviewed the triage vital signs and the nursing notes.  Pertinent labs & imaging results that were available during my care of the patient were reviewed by me and considered in my medical decision making (see chart for details).     Labs: POC urine preg, UA, wet prep  Imaging:  Consults:  Therapeutics:  Discharge Meds:    Assessment/Plan: 89 YOF presents today with complaints of vaginal discharge. She has no purulent vaginal discharge, no abdominal pain or pelvic pain. Patient's wet prep shows no acute findings. Discussed findings with patient, she would like to wait for cultures to return before initiating antibiotics. Patient discharged with outpatient follow-up and strict return precautions. She verbalized understanding and agreement today's plan.    Final Clinical Impressions(s) / ED Diagnoses   Final diagnoses:  Vaginal discharge    ED Discharge Orders    None       Rosalio Loud 05/22/18 Bayard Males, MD 05/22/18 2133

## 2018-05-22 NOTE — ED Triage Notes (Signed)
Patient complains of vaginal discharge x 2 days, request STD testing

## 2018-05-22 NOTE — Discharge Instructions (Addendum)
Please read attached information. If you experience any new or worsening signs or symptoms please return to the emergency room for evaluation. Please follow-up with your primary care provider or specialist as discussed.  °

## 2018-05-23 LAB — GC/CHLAMYDIA PROBE AMP (~~LOC~~) NOT AT ARMC
Chlamydia: NEGATIVE
Neisseria Gonorrhea: NEGATIVE

## 2018-06-28 ENCOUNTER — Encounter (HOSPITAL_COMMUNITY): Payer: Self-pay | Admitting: *Deleted

## 2018-06-28 ENCOUNTER — Emergency Department (HOSPITAL_COMMUNITY)
Admission: EM | Admit: 2018-06-28 | Discharge: 2018-06-28 | Disposition: A | Discharge status: Home / Self Care | Payer: Medicaid Other | Attending: Emergency Medicine | Admitting: Emergency Medicine

## 2018-06-28 ENCOUNTER — Other Ambulatory Visit: Payer: Self-pay

## 2018-06-28 DIAGNOSIS — N760 Acute vaginitis: Secondary | ICD-10-CM | POA: Insufficient documentation

## 2018-06-28 DIAGNOSIS — B9689 Other specified bacterial agents as the cause of diseases classified elsewhere: Secondary | ICD-10-CM

## 2018-06-28 DIAGNOSIS — Z87891 Personal history of nicotine dependence: Secondary | ICD-10-CM | POA: Insufficient documentation

## 2018-06-28 LAB — URINALYSIS, ROUTINE W REFLEX MICROSCOPIC
BILIRUBIN URINE: NEGATIVE
Glucose, UA: NEGATIVE mg/dL
Hgb urine dipstick: NEGATIVE
KETONES UR: NEGATIVE mg/dL
Leukocytes, UA: NEGATIVE
NITRITE: NEGATIVE
PH: 6 (ref 5.0–8.0)
PROTEIN: NEGATIVE mg/dL
Specific Gravity, Urine: 1.011 (ref 1.005–1.030)

## 2018-06-28 LAB — WET PREP, GENITAL
Clue Cells Wet Prep HPF POC: NONE SEEN
Sperm: NONE SEEN
Trich, Wet Prep: NONE SEEN
YEAST WET PREP: NONE SEEN

## 2018-06-28 LAB — POC URINE PREG, ED: PREG TEST UR: NEGATIVE

## 2018-06-28 MED ORDER — METRONIDAZOLE 500 MG PO TABS: 500.0000 mg | ORAL_TABLET | Freq: Two times a day (BID) | ORAL | 0 refills | Status: DC

## 2018-06-28 NOTE — ED Notes (Signed)
Discharge instructions and prescriptions discussed with Pt. Pt verbalized understanding. Pt stable and ambulatory.   

## 2018-06-28 NOTE — Discharge Instructions (Signed)
Please read the instructions below. Talk with your primary care provider about any new medications. Please schedule an appointment for follow up with your OBGYN or primary care. Finish your antibiotic (Flagyl/Metronidazole) as prescribed. Do not drink alcohol with this medication as it will cause vomiting. You will receive a call from the hospital if your test results come back positive. Avoid sexual activity until you know your test results. If your results come back positive, it is important that you inform all of your sexual partners. Return to the ER for new or worsening symptoms.  ° °

## 2018-06-28 NOTE — ED Provider Notes (Signed)
[facility] EMERGENCY DEPARTMENT Provider Note   CSN: [id number] Arrival date & time: 06/28/18  4098     History   Chief Complaint Chief Complaint  Patient presents with  . Vaginal Discharge    HPI Leah Rivera is a 22 y.o. female without significant past medical history, presenting to the emergency department with complaint of persistent thick white vaginal discharge x3 weeks.  Patient states she was evaluated 1 month ago for similar symptoms though had a normal work-up.  She states discharge has been persistent, it is white and thick and copious.  She states this is abnormal for her.  She denies any vaginal pain, pelvic pain, itching or burning, urinary symptoms, abdominal pain, fever or nausea.  States she is currently sexually active with female partners without protection though is only engaging in oral intercourse.  LMP 06/06/2018.  States she has history of BV and this feels the same.  No interventions at home. Per chart review of last ED visit, STD cultures negative.   The history is provided by the patient.    History reviewed. No pertinent past medical history.  Patient Active Problem List   Diagnosis Date Noted  . Vaginal discharge 02/02/2014  . Encounter for counseling regarding contraception 02/02/2014    History reviewed. No pertinent surgical history.   OB History   None     Home Medications    Prior to Admission medications   Medication Sig Start Date End Date Taking? Authorizing Provider  lidocaine (XYLOCAINE) 2 % solution Use as directed 20 mLs in the mouth or throat as needed for mouth pain. Patient not taking: Reported on 06/28/2018 11/16/17   Caccavale, Sophia, PA-C  metroNIDAZOLE (FLAGYL) 500 MG tablet Take 1 tablet (500 mg total) by mouth 2 (two) times daily. 06/28/18   Robinson, [place] N, PA-C  naproxen (NAPROSYN) 500 MG tablet Take 1 tablet (500 mg total) by mouth 2 (two) times daily. Patient not taking: Reported on 06/28/2018 04/13/18    Janne Napoleon, NP  norgestimate-ethinyl estradiol (SPRINTEC 28) 0.25-35 MG-MCG tablet Take 1 tablet by mouth daily. Patient not taking: Reported on 06/28/2018 02/02/14   Garnetta Buddy, MD  omeprazole (PRILOSEC) 20 MG capsule Take 1 capsule (20 mg total) by mouth daily. Take one tablet daily Patient not taking: Reported on 06/28/2018 09/16/16   Gerhard Munch, MD    Family History History reviewed. No pertinent family history.  Social History Social History   Tobacco Use  . Smoking status: Former Smoker    Packs/day: 2.00  . Smokeless tobacco: Never Used  Substance Use Topics  . Alcohol use: Yes    Comment: occ  . Drug use: Yes    Types: Marijuana    Comment: every day     Allergies   Patient has no known allergies.   Review of Systems Review of Systems  Constitutional: Negative for fever.  Gastrointestinal: Negative for abdominal pain, nausea and vomiting.  Genitourinary: Positive for vaginal discharge. Negative for dysuria, frequency, pelvic pain, vaginal bleeding and vaginal pain.  All other systems reviewed and are negative.    Physical Exam Updated Vital Signs BP 128/77   Pulse (!) 47   Temp 98 F (36.7 C) (Oral)   Resp 20   SpO2 100%   Physical Exam  Constitutional: She appears well-developed and well-nourished. No distress.  HENT:  Head: Normocephalic and atraumatic.  Eyes: Conjunctivae are normal.  Cardiovascular: Regular rhythm and normal heart sounds.  Slightly bradycardic  Pulmonary/Chest:  Effort normal and breath sounds normal. No respiratory distress.  Abdominal: Soft. Bowel sounds are normal. She exhibits no distension and no mass. There is no tenderness. There is no rebound and no guarding.  Genitourinary: Uterus normal. There is no rash or tenderness on the right labia. There is no rash on the left labia. Uterus is not enlarged and not tender. Cervix exhibits discharge and friability. Cervix exhibits no motion tenderness. Right adnexum  displays no mass and no tenderness. Left adnexum displays no mass and no tenderness. There is erythema in the vagina. No tenderness in the vagina.  Genitourinary Comments: Exam performed with nurse tech chaperone present.  Moderate to copious amount of thin white malodorous discharge, coating vaginal walls.  Vaginal walls are erythematous and cervix is slightly friable.  Neurological: She is alert.  Skin: Skin is warm.  Psychiatric: She has a normal mood and affect. Her behavior is normal.  Nursing note and vitals reviewed.    ED Treatments / Results  Labs (all labs ordered are listed, but only abnormal results are displayed) Labs Reviewed  WET PREP, GENITAL - Abnormal; Notable for the following components:      Result Value   WBC, Wet Prep HPF POC MODERATE (*)    All other components within normal limits  URINALYSIS, ROUTINE W REFLEX MICROSCOPIC - Abnormal; Notable for the following components:   Bacteria, UA FEW (*)    All other components within normal limits  HIV ANTIBODY (ROUTINE TESTING W REFLEX)  RPR  POC URINE PREG, ED  GC/CHLAMYDIA PROBE AMP ([place]) NOT AT [facility]    EKG None  Radiology No results found.  Procedures Procedures (including critical care time)  Medications Ordered in ED Medications - No data to display   Initial Impression / Assessment and Plan / ED Course  I have reviewed the triage vital signs and the nursing notes.  Pertinent labs & imaging results that were available during my care of the patient were reviewed by me and considered in my medical decision making (see chart for details).     Patient presenting with persistent malodorous vaginal discharge, consistent with previous episode of BV.  Exam with thin white malodorous discharge coating the vaginal walls with associated vaginal erythema and some cervical ability.  Clinical exam consistent with BV, and pt feels as though sx are similar to previous occurrence. No clue cells seen on wet  prep though moderate WBC. Did not test pH, though will treat with flagyl and provide Gyn referral. Pt aware she has STD cultures pending, and that they will need to inform all sexual partners if results return positive. Discussed importance of using protection when sexually active. Exam  not concerning for PID because hemodynamically stable and no cervical motion tenderness on pelvic exam.  Will rx flagyl. Pt has been advised to not drink alcohol while on this medication. Pt afebrile and nontoxic, safe for discharge home.  Discussed results, findings, treatment and follow up. Patient advised of return precautions. Patient verbalized understanding and agreed with plan.   Final Clinical Impressions(s) / ED Diagnoses   Final diagnoses:  BV (bacterial vaginosis)    ED Discharge Orders         Ordered    metroNIDAZOLE (FLAGYL) 500 MG tablet  2 times daily     06/28/18 1504           Robinson, [place] N, [place] 06/28/18 1506    Derwood Kaplan, MD 07/01/18 1506

## 2018-06-28 NOTE — ED Notes (Signed)
ED Provider at bedside. 

## 2018-06-28 NOTE — ED Triage Notes (Signed)
Pt in c/o vaginal discharge and odor x3 weeks, no distress noted

## 2018-06-29 LAB — RPR: RPR: NONREACTIVE

## 2018-06-29 LAB — GC/CHLAMYDIA PROBE AMP (~~LOC~~) NOT AT ARMC
CHLAMYDIA, DNA PROBE: NEGATIVE
NEISSERIA GONORRHEA: NEGATIVE

## 2018-06-29 LAB — HIV ANTIBODY (ROUTINE TESTING W REFLEX): HIV SCREEN 4TH GENERATION: NONREACTIVE

## 2018-08-03 ENCOUNTER — Other Ambulatory Visit (HOSPITAL_COMMUNITY)
Admission: RE | Admit: 2018-08-03 | Discharge: 2018-08-03 | Disposition: A | Discharge status: Home / Self Care | Payer: Medicaid Other | Source: Ambulatory Visit | Attending: Obstetrics and Gynecology | Admitting: Obstetrics and Gynecology

## 2018-08-03 ENCOUNTER — Ambulatory Visit: Payer: Self-pay | Admitting: Obstetrics and Gynecology

## 2018-08-03 ENCOUNTER — Encounter: Payer: Self-pay | Admitting: Obstetrics and Gynecology

## 2018-08-03 VITALS — Ht 69.0 in | Wt 162.1 lb

## 2018-08-03 DIAGNOSIS — Z124 Encounter for screening for malignant neoplasm of cervix: Secondary | ICD-10-CM | POA: Diagnosis present

## 2018-08-03 DIAGNOSIS — Z01419 Encounter for gynecological examination (general) (routine) without abnormal findings: Secondary | ICD-10-CM

## 2018-08-03 DIAGNOSIS — N898 Other specified noninflammatory disorders of vagina: Secondary | ICD-10-CM

## 2018-08-03 MED ORDER — CLINDAMYCIN HCL 300 MG PO CAPS: 300.0000 mg | ORAL_CAPSULE | Freq: Two times a day (BID) | ORAL | 0 refills | Status: DC

## 2018-08-03 NOTE — Progress Notes (Signed)
GYNECOLOGY ANNUAL PREVENTATIVE CARE ENCOUNTER NOTE  Subjective:   Leah Rivera is a 22 y.o. G0P0000 female here for a routine annual gynecologic exam.  Current complaints: recurrent BV.   Denies abnormal vaginal bleeding, discharge, pelvic pain, problems with intercourse or other gynecologic concerns.    Gynecologic History Patient's last menstrual period was 07/06/2018 (approximate). Contraception: none Declines today. Last Pap: Never  Obstetric History OB History  Gravida Para Term Preterm AB Living  0 0 0 0 0 0  SAB TAB Ectopic Multiple Live Births  0 0 0 0 0    Past Medical History:  Diagnosis Date  . Medical history non-contributory     Past Surgical History:  Procedure Laterality Date  . NO PAST SURGERIES    . TOOTH EXTRACTION      Current Outpatient Medications on File Prior to Visit  Medication Sig Dispense Refill  . metroNIDAZOLE (FLAGYL) 500 MG tablet Take 1 tablet (500 mg total) by mouth 2 (two) times daily. 14 tablet 0  . lidocaine (XYLOCAINE) 2 % solution Use as directed 20 mLs in the mouth or throat as needed for mouth pain. (Patient not taking: Reported on 06/28/2018) 100 mL 0  . naproxen (NAPROSYN) 500 MG tablet Take 1 tablet (500 mg total) by mouth 2 (two) times daily. (Patient not taking: Reported on 06/28/2018) 20 tablet 0  . norgestimate-ethinyl estradiol (SPRINTEC 28) 0.25-35 MG-MCG tablet Take 1 tablet by mouth daily. (Patient not taking: Reported on 06/28/2018) 1 Package 11  . omeprazole (PRILOSEC) 20 MG capsule Take 1 capsule (20 mg total) by mouth daily. Take one tablet daily (Patient not taking: Reported on 06/28/2018) 21 capsule 0   No current facility-administered medications on file prior to visit.     No Known Allergies  Social History:  reports that she has quit smoking. She smoked 2.00 packs per day. She has never used smokeless tobacco. She reports that she drinks alcohol. She reports that she has current or past drug history. Drug:  Marijuana.  History reviewed. No pertinent family history.  The following portions of the patient's history were reviewed and updated as appropriate: allergies, current medications, past family history, past medical history, past social history, past surgical history and problem list.  Review of Systems Pertinent items noted in HPI and remainder of comprehensive ROS otherwise negative.   Objective:  Ht 5\' 9"  (1.753 m)   Wt 162 lb 1.6 oz (73.5 kg)   LMP 07/06/2018 (Approximate)   BMI 23.94 kg/m  CONSTITUTIONAL: Well-developed, well-nourished female in no acute distress.  HENT:  Normocephalic, atraumatic, External right and left ear normal. Oropharynx is clear and moist EYES: Conjunctivae and EOM are normal. Pupils are equal, round, and reactive to light. No scleral icterus.  NECK: Normal range of motion, supple, no masses.  Normal thyroid.  SKIN: Skin is warm and dry. No rash noted. Not diaphoretic. No erythema. No pallor. MUSCULOSKELETAL: Normal range of motion. No tenderness.  No cyanosis, clubbing, or edema.  2+ distal pulses. NEUROLOGIC: Alert and oriented to person, place, and time. Normal reflexes, muscle tone coordination. No cranial nerve deficit noted. PSYCHIATRIC: Normal mood and affect. Normal behavior. Normal judgment and thought content. CARDIOVASCULAR: Normal heart rate noted, regular rhythm RESPIRATORY: Clear to auscultation bilaterally. Effort and breath sounds normal, no problems with respiration noted. BREASTS: Symmetric in size. No masses, skin changes, nipple drainage, or lymphadenopathy. ABDOMEN: Soft, normal bowel sounds, no distention noted.  No tenderness, rebound or guarding.  PELVIC: Normal appearing external  genitalia; normal appearing vaginal mucosa and cervix.  No abnormal discharge noted.  Pap smear obtained.  Normal uterine size, no other palpable masses, no uterine or adnexal tenderness.  Assessment and Plan:   1. Pap smear for cervical cancer  screening  - Cytology - PAP  2. Discharge from the vagina  - Cytology - PAP - RX clindamycin   Will follow up results of pap smear and manage accordingly. Routine preventative health maintenance measures emphasized. Please refer to After Visit Summary for other counseling recommendations.    Eh Sauseda, Harolyn RutherfordJennifer I, NP [facility]

## 2018-08-07 LAB — CYTOLOGY - PAP
Adequacy: ABSENT
BACTERIAL VAGINITIS: POSITIVE — AB
CANDIDA VAGINITIS: NEGATIVE
CHLAMYDIA, DNA PROBE: NEGATIVE
Diagnosis: NEGATIVE
Neisseria Gonorrhea: NEGATIVE
Trichomonas: NEGATIVE

## 2018-08-29 ENCOUNTER — Ambulatory Visit (INDEPENDENT_AMBULATORY_CARE_PROVIDER_SITE_OTHER): Payer: Medicaid Other | Admitting: Internal Medicine

## 2018-08-29 ENCOUNTER — Encounter: Payer: Self-pay | Admitting: Internal Medicine

## 2018-08-29 ENCOUNTER — Other Ambulatory Visit (HOSPITAL_COMMUNITY)
Admission: RE | Admit: 2018-08-29 | Discharge: 2018-08-29 | Disposition: A | Discharge status: Home / Self Care | Payer: Medicaid Other | Source: Ambulatory Visit | Attending: Internal Medicine | Admitting: Internal Medicine

## 2018-08-29 VITALS — BP 124/70 | HR 51 | Ht 69.0 in | Wt 163.5 lb

## 2018-08-29 DIAGNOSIS — B9689 Other specified bacterial agents as the cause of diseases classified elsewhere: Secondary | ICD-10-CM

## 2018-08-29 DIAGNOSIS — N898 Other specified noninflammatory disorders of vagina: Secondary | ICD-10-CM

## 2018-08-29 DIAGNOSIS — N760 Acute vaginitis: Secondary | ICD-10-CM | POA: Insufficient documentation

## 2018-08-29 MED ORDER — CLINDAMYCIN HCL 300 MG PO CAPS: 300.0000 mg | ORAL_CAPSULE | Freq: Two times a day (BID) | ORAL | 0 refills | Status: DC

## 2018-08-29 NOTE — Patient Instructions (Addendum)
Take the Clindamycin twice per day for 7 days. Then once you are done with the Clindamycin start using Boric Acid suppositories nightly for one month. These suppositories should be inserted into the vagina prior to bedtime. Do NOT take them by mouth as they are poisonous if consumed orally. Boric acid is available at most drug stores and on [employer].    Bacterial Vaginosis Bacterial vaginosis is an infection of the vagina. It happens when too many germs (bacteria) grow in the vagina. This infection puts you at risk for infections from sex (STIs). Treating this infection can lower your risk for some STIs. You should also treat this if you are pregnant. It can cause your baby to be born early. Follow these instructions at home: Medicines  Take over-the-counter and prescription medicines only as told by your doctor.  Take or use your antibiotic medicine as told by your doctor. Do not stop taking or using it even if you start to feel better. General instructions  If you your sexual partner is a woman, tell her that you have this infection. She needs to get treatment if she has symptoms. If you have a female partner, he does not need to be treated.  During treatment: ? Avoid sex. ? Do not douche. ? Avoid alcohol as told. ? Avoid breastfeeding as told.  Drink enough fluid to keep your pee (urine) clear or pale yellow.  Keep your vagina and butt (rectum) clean. ? Wash the area with warm water every day. ? Wipe from front to back after you use the toilet.  Keep all follow-up visits as told by your doctor. This is important. Preventing this condition  Do not douche.  Use only warm water to wash around your vagina.  Use protection when you have sex. This includes: ? Latex condoms. ? Dental dams.  Limit how many people you have sex with. It is best to only have sex with the same person (be monogamous).  Get tested for STIs. Have your partner get tested.  Wear underwear that is cotton or  lined with cotton.  Avoid tight pants and pantyhose. This is most important in summer.  Do not use any products that have nicotine or tobacco in them. These include cigarettes and e-cigarettes. If you need help quitting, ask your doctor.  Do not use illegal drugs.  Limit how much alcohol you drink. Contact a doctor if:  Your symptoms do not get better, even after you are treated.  You have more discharge or pain when you pee (urinate).  You have a fever.  You have pain in your belly (abdomen).  You have pain with sex.  Your bleed from your vagina between periods. Summary  This infection happens when too many germs (bacteria) grow in the vagina.  Treating this condition can lower your risk for some infections from sex (STIs).  You should also treat this if you are pregnant. It can cause early (premature) birth.  Do not stop taking or using your antibiotic medicine even if you start to feel better. This information is not intended to replace advice given to you by your health care provider. Make sure you discuss any questions you have with your health care provider. Document Released: 06/15/2008 Document Revised: 05/22/2016 Document Reviewed: 05/22/2016 Elsevier Interactive Patient Education  2017 [employer]

## 2018-08-29 NOTE — Progress Notes (Signed)
   Subjective:    Leah Rivera - 22 y.o. female MRN [id number]  Date of birth: 12/28/1995  HPI  Leah Rivera is a 22 y.o. G0P0000 female here for vaginal discharge. Reports discharge has been recurrent for the past 5 years. She has been diagnosed and treated for BV with Flagyl several times. Reports that symptoms will resolve for 1-2 weeks and then return. She was most recently treated with Clindamycin in Oct for BV and reports this cleared up her symptoms for longer than Flagyl has in the past. Has never tried Boric Acid. Triggers seem to be related to sexual intercourse.      OB History    Gravida  0   Para  0   Term  0   Preterm  0   AB  0   Living  0     SAB  0   TAB  0   Ectopic  0   Multiple  0   Live Births  0            -  reports that she has quit smoking. She smoked 2.00 packs per day. She has never used smokeless tobacco. - Review of Systems: Per HPI. - Past Medical History: Patient Active Problem List   Diagnosis Date Noted  . Vaginal discharge 02/02/2014  . Encounter for counseling regarding contraception 02/02/2014   - Medications: reviewed and updated   Objective:   Physical Exam BP 124/70   Pulse (!) 51   Ht 5\' 9"  (1.753 m)   Wt 163 lb 8 oz (74.2 kg)   LMP 07/30/2018   BMI 24.14 kg/m  Gen: NAD, alert, cooperative with exam, well-appearing GU/GYN: Exam performed in the presence of a chaperone. External genitalia within normal limits.  Vaginal mucosa pink, moist, normal rugae.  Nonfriable cervix without lesions or bleeding noted on speculum exam. Small amount of thin, white milky discharge in vaginal vault. +whiff test. Bimanual exam revealed normal, nongravid uterus.  No cervical motion tenderness. No adnexal masses bilaterally.           Assessment & Plan:   1. Vaginal discharge Suspect symptoms related to recurrent BV given history and exam findings. Patient requests treatment with PO Clindamycin in place of Flagyl. 7 day course  prescribed. Recommended 1 month of nightly boric acid vaginal suppositories after completion of Clindamycin. Cultures and wet prep pending.  - clindamycin (CLEOCIN) 300 MG capsule; Take 1 capsule (300 mg total) by mouth 2 (two) times daily.  Dispense: 14 capsule; Refill: 0 - Cervicovaginal ancillary only( [place])  2. Bacterial vaginosis - clindamycin (CLEOCIN) 300 MG capsule; Take 1 capsule (300 mg total) by mouth 2 (two) times daily.  Dispense: 14 capsule; Refill: 0 - Cervicovaginal ancillary only( [place])    Routine preventative health maintenance measures emphasized. Please refer to After Visit Summary for other counseling recommendations.   Return if symptoms worsen or fail to improve.  Marcy Sirenatherine Stormey Wilborn, D.O. OB Fellow  08/29/2018, 4:52 PM

## 2018-09-01 LAB — CERVICOVAGINAL ANCILLARY ONLY
Bacterial vaginitis: POSITIVE — AB
CHLAMYDIA, DNA PROBE: NEGATIVE
Candida vaginitis: NEGATIVE
NEISSERIA GONORRHEA: NEGATIVE
Trichomonas: NEGATIVE

## 2019-02-27 ENCOUNTER — Other Ambulatory Visit: Payer: Self-pay

## 2019-02-27 ENCOUNTER — Ambulatory Visit (INDEPENDENT_AMBULATORY_CARE_PROVIDER_SITE_OTHER): Payer: Self-pay | Admitting: *Deleted

## 2019-02-27 VITALS — BP 118/69 | HR 57 | Temp 98.3°F | Ht 69.0 in | Wt 156.5 lb

## 2019-02-27 DIAGNOSIS — N760 Acute vaginitis: Secondary | ICD-10-CM

## 2019-02-27 DIAGNOSIS — N898 Other specified noninflammatory disorders of vagina: Secondary | ICD-10-CM

## 2019-02-27 DIAGNOSIS — Z113 Encounter for screening for infections with a predominantly sexual mode of transmission: Secondary | ICD-10-CM

## 2019-02-27 DIAGNOSIS — B9689 Other specified bacterial agents as the cause of diseases classified elsewhere: Secondary | ICD-10-CM

## 2019-02-27 NOTE — Progress Notes (Signed)
Pt presents for self swab.  Pt states she has noticed an odor to her vaginal discharge for 4 days. Pt denies increase or change in color of discharge.  Pt states she did try boric acid capsules inserted vaginally without relief. Pt instructed on how to perform a self swab. Allergies, pharmacy and medications verified with pt.  Advised pt that it takes 24-48 hours for result and she would be contacted if they show anything abnormal.  Pt requests that medication for treatement, if it is BV, not be "the one that you can't drink alcohol with.  The other one works better."

## 2019-02-28 LAB — CERVICOVAGINAL ANCILLARY ONLY
Bacterial vaginitis: POSITIVE — AB
Candida vaginitis: NEGATIVE
Chlamydia: NEGATIVE
Neisseria Gonorrhea: NEGATIVE
Trichomonas: NEGATIVE

## 2019-03-02 DIAGNOSIS — B9689 Other specified bacterial agents as the cause of diseases classified elsewhere: Secondary | ICD-10-CM

## 2019-03-03 NOTE — Progress Notes (Signed)
Chart reviewed for nurse visit. Agree with plan of care.   Nicolette Bang, DO 03/03/2019 10:47 PM

## 2019-03-05 MED ORDER — METRONIDAZOLE 500 MG PO TABS: 500.0000 mg | ORAL_TABLET | Freq: Two times a day (BID) | ORAL | 0 refills | Status: DC

## 2019-07-12 ENCOUNTER — Other Ambulatory Visit: Payer: Self-pay

## 2019-07-12 DIAGNOSIS — N760 Acute vaginitis: Secondary | ICD-10-CM

## 2019-07-12 DIAGNOSIS — B9689 Other specified bacterial agents as the cause of diseases classified elsewhere: Secondary | ICD-10-CM

## 2019-07-23 ENCOUNTER — Ambulatory Visit: Payer: Medicaid Other

## 2019-07-30 ENCOUNTER — Ambulatory Visit (INDEPENDENT_AMBULATORY_CARE_PROVIDER_SITE_OTHER): Payer: Self-pay | Admitting: *Deleted

## 2019-07-30 ENCOUNTER — Other Ambulatory Visit (HOSPITAL_COMMUNITY)
Admission: RE | Admit: 2019-07-30 | Discharge: 2019-07-30 | Disposition: A | Discharge status: Home / Self Care | Payer: Medicaid Other | Source: Ambulatory Visit | Attending: Family Medicine | Admitting: Family Medicine

## 2019-07-30 ENCOUNTER — Other Ambulatory Visit: Payer: Self-pay

## 2019-07-30 DIAGNOSIS — N760 Acute vaginitis: Secondary | ICD-10-CM | POA: Diagnosis not present

## 2019-07-30 DIAGNOSIS — N898 Other specified noninflammatory disorders of vagina: Secondary | ICD-10-CM | POA: Diagnosis present

## 2019-07-30 DIAGNOSIS — A5602 Chlamydial vulvovaginitis: Secondary | ICD-10-CM | POA: Insufficient documentation

## 2019-07-30 DIAGNOSIS — B9689 Other specified bacterial agents as the cause of diseases classified elsewhere: Secondary | ICD-10-CM | POA: Diagnosis not present

## 2019-07-30 NOTE — Progress Notes (Signed)
I have reviewed chart and agree with plan.  

## 2019-07-30 NOTE — Progress Notes (Signed)
Feel like I have bacterial infection with fishy odor. Some white vag d/c for a week. Has had BV in past and would like to be checked for STDs. Instructed in self-swab and obtained without difficulty. Has mychart and understands will gets in couple of days. Will be notified if anything is positive. Agrees with POC. Pt requested if she has BV to be treated with Clindamycin again which she has used in the past

## 2019-08-01 LAB — CERVICOVAGINAL ANCILLARY ONLY
Bacterial Vaginitis (gardnerella): POSITIVE — AB
Candida Glabrata: NEGATIVE
Candida Vaginitis: NEGATIVE
Chlamydia: POSITIVE — AB
Comment: NEGATIVE
Comment: NEGATIVE
Comment: NEGATIVE
Comment: NEGATIVE
Comment: NEGATIVE
Comment: NORMAL
Neisseria Gonorrhea: NEGATIVE
Trichomonas: NEGATIVE

## 2019-08-02 ENCOUNTER — Telehealth: Payer: Self-pay | Admitting: Family Medicine

## 2019-08-02 DIAGNOSIS — B9689 Other specified bacterial agents as the cause of diseases classified elsewhere: Secondary | ICD-10-CM | POA: Insufficient documentation

## 2019-08-02 DIAGNOSIS — A749 Chlamydial infection, unspecified: Secondary | ICD-10-CM | POA: Insufficient documentation

## 2019-08-02 DIAGNOSIS — N760 Acute vaginitis: Secondary | ICD-10-CM | POA: Insufficient documentation

## 2019-08-02 HISTORY — DX: Chlamydial infection, unspecified: A74.9

## 2019-08-02 MED ORDER — AZITHROMYCIN 250 MG PO TABS: 1000.0000 mg | ORAL_TABLET | Freq: Once | ORAL | 0 refills | Status: AC

## 2019-08-02 MED ORDER — AZITHROMYCIN 500 MG PO TABS: 1000.0000 mg | ORAL_TABLET | Freq: Once | ORAL | 0 refills | Status: DC

## 2019-08-02 MED ORDER — AZITHROMYCIN 500 MG PO TABS: 1000.0000 mg | ORAL_TABLET | Freq: Once | ORAL | Status: DC

## 2019-08-02 MED ORDER — CLINDAMYCIN PHOSPHATE 2 % VA CREA: 1.0000 | TOPICAL_CREAM | Freq: Every day | VAGINAL | 0 refills | Status: DC

## 2019-08-02 MED ORDER — AZITHROMYCIN 500 MG PO TABS: 1000.0000 mg | ORAL_TABLET | Freq: Once | ORAL | 0 refills | Status: AC

## 2019-08-02 NOTE — Telephone Encounter (Signed)
Please call patient and let her know swab came back positive for chlamydia and BV.  Have sent Azithromycin for chlamydia and clindamycin for BV per her stated preference, please also offer partner treatment.

## 2019-08-02 NOTE — Addendum Note (Signed)
Addended by: Donn Pierini on: 08/02/2019 09:16 AM   Modules accepted: Orders

## 2019-08-02 NOTE — Telephone Encounter (Signed)
Called and spoke with pt. She is aware via my Chart of her results. She is aware to go and pick up medications. Order sent on for partner treatment. Pt aware to avoid sexual intercourse for 1-2 weeks after both partners are treated.

## 2019-09-10 ENCOUNTER — Other Ambulatory Visit (HOSPITAL_COMMUNITY)
Admission: RE | Admit: 2019-09-10 | Discharge: 2019-09-10 | Disposition: A | Discharge status: Home / Self Care | Payer: Medicaid Other | Source: Ambulatory Visit | Attending: Family Medicine | Admitting: Family Medicine

## 2019-09-10 ENCOUNTER — Ambulatory Visit (INDEPENDENT_AMBULATORY_CARE_PROVIDER_SITE_OTHER): Payer: Medicaid Other | Admitting: *Deleted

## 2019-09-10 ENCOUNTER — Encounter (HOSPITAL_COMMUNITY): Payer: Self-pay

## 2019-09-10 ENCOUNTER — Ambulatory Visit (HOSPITAL_COMMUNITY)
Admission: EM | Admit: 2019-09-10 | Discharge: 2019-09-10 | Disposition: A | Discharge status: Home / Self Care | Payer: Self-pay | Attending: Family Medicine | Admitting: Family Medicine

## 2019-09-10 ENCOUNTER — Other Ambulatory Visit: Payer: Self-pay

## 2019-09-10 DIAGNOSIS — Z202 Contact with and (suspected) exposure to infections with a predominantly sexual mode of transmission: Secondary | ICD-10-CM | POA: Insufficient documentation

## 2019-09-10 DIAGNOSIS — N760 Acute vaginitis: Secondary | ICD-10-CM | POA: Diagnosis not present

## 2019-09-10 DIAGNOSIS — T1591XA Foreign body on external eye, part unspecified, right eye, initial encounter: Secondary | ICD-10-CM

## 2019-09-10 DIAGNOSIS — B373 Candidiasis of vulva and vagina: Secondary | ICD-10-CM | POA: Diagnosis not present

## 2019-09-10 DIAGNOSIS — A5909 Other urogenital trichomoniasis: Secondary | ICD-10-CM | POA: Diagnosis not present

## 2019-09-10 DIAGNOSIS — B9689 Other specified bacterial agents as the cause of diseases classified elsewhere: Secondary | ICD-10-CM | POA: Diagnosis not present

## 2019-09-10 MED ORDER — TETRACAINE HCL 0.5 % OP SOLN
OPHTHALMIC | Status: AC
  Filled 2019-09-10: qty 4

## 2019-09-10 MED ORDER — AZITHROMYCIN 250 MG PO TABS
1000.0000 mg | ORAL_TABLET | Freq: Once | ORAL | Status: AC
  Administered 2019-09-10: 1000 mg via ORAL

## 2019-09-10 MED ORDER — CEFTRIAXONE SODIUM 250 MG IJ SOLR
250.0000 mg | Freq: Once | INTRAMUSCULAR | Status: AC
  Administered 2019-09-10: 250 mg via INTRAMUSCULAR

## 2019-09-10 NOTE — Progress Notes (Signed)
Here for std testing. Was with someone who recently tested positive for GC. Having some vag d/c that is white but she feels is nothing different except maybe more d/c than usual. Would like tx for GC. Denies any pain. Discussed with Dr Vivien Rota and will treat pt for [facility] today in office which pt desires. Pt agrees with plan of care. Zithromax 1GM po and Rocephin 250mg  given and pt tol well. Understands no sex for a wk after last person treated. States partner has been treated. Pt has mychart and will look for results there in next few days.

## 2019-09-10 NOTE — Discharge Instructions (Addendum)
Please try to flush your eye with water as needed  Please follow up if your symptom fail to improve.

## 2019-09-10 NOTE — ED Triage Notes (Signed)
Pt present right eye irration, symptoms started 3 days ago. Pt feel she scratch her right eye and now there is some discomfort.

## 2019-09-10 NOTE — ED Provider Notes (Signed)
[facility]    CSN: [id number] Arrival date & time: 09/10/19  1723      History   Chief Complaint Chief Complaint  Patient presents with  . Eye Problem    HPI Leah Rivera is a 23 y.o. female.   She is presenting with right eye pain.  She is having some redness as well as pain with movement.  She thinks she may have scratched her eye.  Denies any history of similar symptoms.  Does not use contacts or glasses.  Denies any penetrating trauma.  Does not work around debris.  Symptoms been ongoing for 3 days.  HPI  Past Medical History:  Diagnosis Date  . Medical history non-contributory     Patient Active Problem List   Diagnosis Date Noted  . BV (bacterial vaginosis) 08/02/2019  . Chlamydia 08/02/2019  . Vaginal discharge 02/02/2014  . Encounter for counseling regarding contraception 02/02/2014    Past Surgical History:  Procedure Laterality Date  . NO PAST SURGERIES    . TOOTH EXTRACTION      OB History    Gravida  0   Para  0   Term  0   Preterm  0   AB  0   Living  0     SAB  0   TAB  0   Ectopic  0   Multiple  0   Live Births  0            Home Medications    Prior to Admission medications   Medication Sig Start Date End Date Taking? Authorizing Provider  BORIC ACID EX Apply 1 application topically.    [provider]    Family History History reviewed. No pertinent family history.  Social History Social History   Tobacco Use  . Smoking status: Former Smoker    Packs/day: 2.00  . Smokeless tobacco: Never Used  Substance Use Topics  . Alcohol use: Yes    Comment: occ  . Drug use: Yes    Types: Marijuana    Comment: every day     Allergies   Patient has no known allergies.   Review of Systems Review of Systems  Constitutional: Negative for fever.  HENT: Negative for congestion.   Eyes: Positive for pain and redness.  Respiratory: Negative for cough.   Cardiovascular: Negative for chest pain.   Gastrointestinal: Negative for abdominal pain.  Musculoskeletal: Negative for back pain.  Skin: Negative for color change.  Neurological: Negative for weakness.  Hematological: Negative for adenopathy.     Physical Exam Triage Vital Signs ED Triage Vitals  Enc Vitals Group     BP 09/10/19 1801 121/66     Pulse Rate 09/10/19 1801 64     Resp 09/10/19 1801 16     Temp 09/10/19 1801 98.6 F (37 C)     Temp Source 09/10/19 1801 Oral     SpO2 09/10/19 1801 100 %     Weight --      Height --      Head Circumference --      Peak Flow --      Pain Score 09/10/19 1802 0     Pain Loc --      Pain Edu? --      Excl. in GC? --    No data found.  Updated Vital Signs BP 121/66 (BP Location: Right Arm)   Pulse 64   Temp 98.6 F (37 C) (Oral)   Resp  16   LMP 08/15/2019   SpO2 100%   Visual Acuity Right Eye Distance:   Left Eye Distance:   Bilateral Distance:    Right Eye Near: R Near: 20/20 Left Eye Near:  L Near: 20/20 Bilateral Near:  20/20  Physical Exam Gen: NAD, alert, cooperative with exam, well-appearing ENT: normal lips, normal nasal mucosa,  Eye: normal EOM, injected right sclera, pupils are equal and reactive, CV:  no edema, +2 pedal pulses   Resp: no accessory muscle use, non-labored,  Skin: no rashes, no areas of induration  Neuro: normal tone, normal sensation to touch Psych:  normal insight, alert and oriented MSK: Normal gait, normal strength   UC Treatments / Results  Labs (all labs ordered are listed, but only abnormal results are displayed) Labs Reviewed - No data to display  EKG   Radiology No results found.  Procedures Procedures (including critical care time)  Anterior chamber clear,  Proparacaine 2 gtts applied,with less soreness after gtts. Lids everted, eyelash identified and removed. Fluorescein applied, no uptake.  Flushed with sterile water, no complications.   Medications Ordered in UC Medications - No data to  display  Initial Impression / Assessment and Plan / UC Course  I have reviewed the triage vital signs and the nursing notes.  Pertinent labs & imaging results that were available during my care of the patient were reviewed by me and considered in my medical decision making (see chart for details).     Ms. Sliwinski is a 23 year old female that is presenting with right eye pain.  Visual acuity was normal.  An eyelash was removed from the eye.  No uptake with fluorescein.  Washed and had improvement of her symptoms after this.  Counseled on supportive care.  Given indications to follow-up.  Final Clinical Impressions(s) / UC Diagnoses   Final diagnoses:  Foreign body of right eye, initial encounter     Discharge Instructions     Please try to flush your eye with water as needed  Please follow up if your symptom fail to improve.      ED Prescriptions    None     PDMP not reviewed this encounter.   Rosemarie Ax, MD 09/10/19 [phone]

## 2019-09-11 NOTE — Progress Notes (Signed)
I have reviewed this chart and agree with the RN/CMA assessment and management.    K. Meryl Veona Bittman, M.D. [facility]ending [facility]Faculty [facility])   

## 2019-09-12 LAB — CERVICOVAGINAL ANCILLARY ONLY
Bacterial Vaginitis (gardnerella): POSITIVE — AB
Candida Glabrata: NEGATIVE
Candida Vaginitis: POSITIVE — AB
Chlamydia: NEGATIVE
Comment: NEGATIVE
Comment: NEGATIVE
Comment: NEGATIVE
Comment: NEGATIVE
Comment: NEGATIVE
Comment: NORMAL
Neisseria Gonorrhea: NEGATIVE
Trichomonas: POSITIVE — AB

## 2019-09-14 ENCOUNTER — Other Ambulatory Visit: Payer: Self-pay | Admitting: Family Medicine

## 2019-09-14 MED ORDER — FLUCONAZOLE 150 MG PO TABS: 150.0000 mg | ORAL_TABLET | ORAL | 0 refills | Status: DC

## 2019-09-14 MED ORDER — METRONIDAZOLE 500 MG PO TABS: 2000.0000 mg | ORAL_TABLET | Freq: Once | ORAL | 0 refills | Status: AC

## 2019-09-17 ENCOUNTER — Other Ambulatory Visit: Payer: Self-pay

## 2019-09-17 ENCOUNTER — Telehealth: Payer: Self-pay | Admitting: Obstetrics and Gynecology

## 2019-09-17 MED ORDER — FLUCONAZOLE 150 MG PO TABS: 150.0000 mg | ORAL_TABLET | ORAL | 0 refills | Status: DC

## 2019-09-17 MED ORDER — METRONIDAZOLE 500 MG PO TABS: 500.0000 mg | ORAL_TABLET | Freq: Two times a day (BID) | ORAL | 0 refills | Status: DC

## 2019-09-17 NOTE — Progress Notes (Signed)
Patient messaged about RX never making it to the pharmacy and that she switched her pharmacy to a different one. Sending RX to pharmacy for patient.

## 2019-09-17 NOTE — Telephone Encounter (Signed)
The patient stated the prescriptions she was told were filled are not at the pharmacy ready for pick up. She stated they were sent to the wrong pharmacy. The prescriptions are Metronidazole and diflucan. She would [facility] [address] [facility] [address]

## 2019-09-18 ENCOUNTER — Telehealth: Payer: Medicaid Other

## 2019-09-18 ENCOUNTER — Other Ambulatory Visit: Payer: Self-pay | Admitting: General Practice

## 2019-09-18 ENCOUNTER — Ambulatory Visit: Payer: Medicaid Other | Admitting: Student

## 2019-09-18 MED ORDER — FLUCONAZOLE 150 MG PO TABS: 150.0000 mg | ORAL_TABLET | ORAL | 0 refills | Status: DC

## 2019-09-19 ENCOUNTER — Telehealth: Payer: Self-pay | Admitting: Obstetrics and Gynecology

## 2019-09-19 NOTE — Telephone Encounter (Signed)
The patient would like her prescription sent to the pharmacy. She sent a message stating she had positive test results however no one has provided the medication for her.

## 2019-10-24 ENCOUNTER — Other Ambulatory Visit: Payer: Self-pay

## 2019-10-24 ENCOUNTER — Ambulatory Visit (INDEPENDENT_AMBULATORY_CARE_PROVIDER_SITE_OTHER): Payer: Self-pay

## 2019-10-24 ENCOUNTER — Other Ambulatory Visit (HOSPITAL_COMMUNITY)
Admission: RE | Admit: 2019-10-24 | Discharge: 2019-10-24 | Disposition: A | Discharge status: Home / Self Care | Payer: Medicaid Other | Source: Ambulatory Visit | Attending: Family Medicine | Admitting: Family Medicine

## 2019-10-24 DIAGNOSIS — N898 Other specified noninflammatory disorders of vagina: Secondary | ICD-10-CM | POA: Insufficient documentation

## 2019-10-24 NOTE — Progress Notes (Signed)
Patient seen and assessed by nursing staff during this encounter. I have reviewed the chart and agree with the documentation and plan.  Salvadore Valvano, MD 10/24/2019 4:49 PM    

## 2019-10-24 NOTE — Progress Notes (Signed)
Pt here today with c/o vaginal discharge that she feels is BV.  Pt reports that she gets BV all the time and does not know why.  Per chart review pt has tested positive for BV since 07/2018.  I asked pt if she taken Boric Acid, pt reports that she has taken it but it does not help.  Pt requested to go ahead and receive treatment.  I explained to the pt that I can e-prescribe Flagyl per protocol.  Pt reports that she does not want that prescribed because that does not work either.  Pt reports that she has taken something different in the past.  Per chart review pt was prescribed Clindamycin.  Pt states that she recalls that she thinks that it is the medication that she would prefer.  I informed pt that we will wait to see what the results are before prescribing Clindamycin being prescribed.  I advised pt that I would let the provider know that it is her preference and that the results will take approx 24-48 hrs.  Pt verbalized understanding.   Addison Naegeli, RN 10/24/2019

## 2019-10-25 LAB — CERVICOVAGINAL ANCILLARY ONLY
Bacterial Vaginitis (gardnerella): POSITIVE — AB
Candida Glabrata: NEGATIVE
Candida Vaginitis: POSITIVE — AB
Chlamydia: NEGATIVE
Comment: NEGATIVE
Comment: NEGATIVE
Comment: NEGATIVE
Comment: NEGATIVE
Comment: NEGATIVE
Comment: NORMAL
Neisseria Gonorrhea: NEGATIVE
Trichomonas: NEGATIVE

## 2019-10-26 ENCOUNTER — Other Ambulatory Visit: Payer: Self-pay | Admitting: Obstetrics & Gynecology

## 2019-10-26 ENCOUNTER — Other Ambulatory Visit: Payer: Self-pay

## 2019-10-26 DIAGNOSIS — B373 Candidiasis of vulva and vagina: Secondary | ICD-10-CM

## 2019-10-26 DIAGNOSIS — N760 Acute vaginitis: Secondary | ICD-10-CM

## 2019-10-26 DIAGNOSIS — B9689 Other specified bacterial agents as the cause of diseases classified elsewhere: Secondary | ICD-10-CM

## 2019-10-26 DIAGNOSIS — B3731 Acute candidiasis of vulva and vagina: Secondary | ICD-10-CM

## 2019-10-26 MED ORDER — CLINDAMYCIN HCL 300 MG PO CAPS: 300.0000 mg | ORAL_CAPSULE | Freq: Two times a day (BID) | ORAL | 2 refills | Status: DC

## 2019-10-26 MED ORDER — FLUCONAZOLE 150 MG PO TABS: 150.0000 mg | ORAL_TABLET | ORAL | 3 refills | Status: DC

## 2021-01-01 ENCOUNTER — Ambulatory Visit: Payer: Medicaid Other | Admitting: Student

## 2021-04-04 ENCOUNTER — Emergency Department (HOSPITAL_COMMUNITY)
Admission: EM | Admit: 2021-04-04 | Discharge: 2021-04-04 | Disposition: A | Discharge status: Home / Self Care | Payer: Self-pay | Attending: Emergency Medicine | Admitting: Emergency Medicine

## 2021-04-04 ENCOUNTER — Other Ambulatory Visit: Payer: Self-pay

## 2021-04-04 ENCOUNTER — Encounter (HOSPITAL_COMMUNITY): Payer: Self-pay

## 2021-04-04 ENCOUNTER — Emergency Department (HOSPITAL_COMMUNITY): Payer: Self-pay

## 2021-04-04 DIAGNOSIS — B9689 Other specified bacterial agents as the cause of diseases classified elsewhere: Secondary | ICD-10-CM | POA: Insufficient documentation

## 2021-04-04 DIAGNOSIS — R1084 Generalized abdominal pain: Secondary | ICD-10-CM

## 2021-04-04 DIAGNOSIS — N898 Other specified noninflammatory disorders of vagina: Secondary | ICD-10-CM | POA: Insufficient documentation

## 2021-04-04 DIAGNOSIS — N760 Acute vaginitis: Secondary | ICD-10-CM | POA: Insufficient documentation

## 2021-04-04 DIAGNOSIS — Z87891 Personal history of nicotine dependence: Secondary | ICD-10-CM | POA: Insufficient documentation

## 2021-04-04 LAB — COMPREHENSIVE METABOLIC PANEL
ALT: 14 U/L (ref 0–44)
AST: 24 U/L (ref 15–41)
Albumin: 4.9 g/dL (ref 3.5–5.0)
Alkaline Phosphatase: 67 U/L (ref 38–126)
Anion gap: 12 (ref 5–15)
BUN: 9 mg/dL (ref 6–20)
CO2: 24 mmol/L (ref 22–32)
Calcium: 10.5 mg/dL — ABNORMAL HIGH (ref 8.9–10.3)
Chloride: 104 mmol/L (ref 98–111)
Creatinine, Ser: 0.85 mg/dL (ref 0.44–1.00)
GFR, Estimated: >60 mL/min (ref >60)
Glucose, Bld: 106 mg/dL — ABNORMAL HIGH (ref 70–99)
Potassium: 3.9 mmol/L (ref 3.5–5.1)
Sodium: 140 mmol/L (ref 135–145)
Total Bilirubin: 1.1 mg/dL (ref 0.3–1.2)
Total Protein: 8.9 g/dL — ABNORMAL HIGH (ref 6.5–8.1)

## 2021-04-04 LAB — URINALYSIS, ROUTINE W REFLEX MICROSCOPIC
Bacteria, UA: NONE SEEN
Bilirubin Urine: NEGATIVE
Glucose, UA: NEGATIVE mg/dL
Ketones, ur: NEGATIVE mg/dL
Leukocytes,Ua: NEGATIVE
Nitrite: NEGATIVE
Protein, ur: NEGATIVE mg/dL
Specific Gravity, Urine: 1.026 (ref 1.005–1.030)
pH: 5 (ref 5.0–8.0)

## 2021-04-04 LAB — CBC WITH DIFFERENTIAL/PLATELET
Abs Immature Granulocytes: 0.06 10*3/uL (ref 0.00–0.07)
Basophils Absolute: 0 10*3/uL (ref 0.0–0.1)
Basophils Relative: 0 %
Eosinophils Absolute: 0 10*3/uL (ref 0.0–0.5)
Eosinophils Relative: 0 %
HCT: 41.6 % (ref 36.0–46.0)
Hemoglobin: 13.3 g/dL (ref 12.0–15.0)
Immature Granulocytes: 0 %
Lymphocytes Relative: 9 %
Lymphs Abs: 1.3 10*3/uL (ref 0.7–4.0)
MCH: 29.2 pg (ref 26.0–34.0)
MCHC: 32 g/dL (ref 30.0–36.0)
MCV: 91.4 fL (ref 80.0–100.0)
Monocytes Absolute: 0.4 10*3/uL (ref 0.1–1.0)
Monocytes Relative: 3 %
Neutro Abs: 11.8 10*3/uL — ABNORMAL HIGH (ref 1.7–7.7)
Neutrophils Relative %: 88 %
Platelets: 256 10*3/uL (ref 150–400)
RBC: 4.55 MIL/uL (ref 3.87–5.11)
RDW: 14 % (ref 11.5–15.5)
WBC: 13.6 10*3/uL — ABNORMAL HIGH (ref 4.0–10.5)
nRBC: 0 % (ref 0.0–0.2)

## 2021-04-04 LAB — WET PREP, GENITAL
Sperm: NONE SEEN
Trich, Wet Prep: NONE SEEN
Yeast Wet Prep HPF POC: NONE SEEN

## 2021-04-04 LAB — I-STAT BETA HCG BLOOD, ED (MC, WL, AP ONLY): I-stat hCG, quantitative: <5 m[IU]/mL (ref <5)

## 2021-04-04 LAB — LIPASE, BLOOD: Lipase: 22 U/L (ref 11–51)

## 2021-04-04 MED ORDER — HYDROCODONE-ACETAMINOPHEN 5-325 MG PO TABS: 1.0000 | ORAL_TABLET | Freq: Four times a day (QID) | ORAL | 0 refills | Status: DC | PRN

## 2021-04-04 MED ORDER — FENTANYL CITRATE (PF) 100 MCG/2ML IJ SOLN
50.0000 ug | Freq: Once | INTRAMUSCULAR | Status: AC
  Administered 2021-04-04: 50 ug via INTRAVENOUS
  Filled 2021-04-04: qty 2

## 2021-04-04 MED ORDER — MORPHINE SULFATE (PF) 4 MG/ML IV SOLN
4.0000 mg | Freq: Once | INTRAVENOUS | Status: AC
  Administered 2021-04-04: 4 mg via INTRAVENOUS
  Filled 2021-04-04: qty 1

## 2021-04-04 MED ORDER — IOHEXOL 350 MG/ML SOLN
80.0000 mL | Freq: Once | INTRAVENOUS | Status: AC | PRN
  Administered 2021-04-04: 80 mL via INTRAVENOUS

## 2021-04-04 MED ORDER — METRONIDAZOLE 500 MG PO TABS: 500.0000 mg | ORAL_TABLET | Freq: Two times a day (BID) | ORAL | 0 refills | Status: DC

## 2021-04-04 MED ORDER — HYDROMORPHONE HCL 1 MG/ML IJ SOLN
0.5000 mg | Freq: Once | INTRAMUSCULAR | Status: AC
  Administered 2021-04-04: 0.5 mg via INTRAVENOUS
  Filled 2021-04-04: qty 1

## 2021-04-04 MED ORDER — HYDROCODONE-ACETAMINOPHEN 5-325 MG PO TABS
1.0000 | ORAL_TABLET | Freq: Once | ORAL | Status: AC
  Administered 2021-04-04: 1 via ORAL
  Filled 2021-04-04: qty 1

## 2021-04-04 MED ORDER — ONDANSETRON HCL 4 MG/2ML IJ SOLN
4.0000 mg | Freq: Once | INTRAMUSCULAR | Status: AC
  Administered 2021-04-04: 4 mg via INTRAVENOUS
  Filled 2021-04-04: qty 2

## 2021-04-04 MED ORDER — KETOROLAC TROMETHAMINE 30 MG/ML IJ SOLN
30.0000 mg | Freq: Once | INTRAMUSCULAR | Status: AC
  Administered 2021-04-04: 30 mg via INTRAVENOUS
  Filled 2021-04-04: qty 1

## 2021-04-04 MED ORDER — SODIUM CHLORIDE (PF) 0.9 % IJ SOLN
INTRAMUSCULAR | Status: AC
  Filled 2021-04-04: qty 50

## 2021-04-04 NOTE — Discharge Instructions (Addendum)
As we discussed, your work-up today was reassuring.  Your CT scan did show probable uterine fibroid which may be contributing to your pain.  No follow-up with referred OB/GYN.  You also had evidence of BV on a pelvic exam today. Take Flagyl as directed.  It is very important that you do not consume any alcohol while taking this medication as it will cause you to become violently ill.  You have gonorrhea and Chlamydia test pending.  These will take about 2 days.  If you are positive, they will notify you.  If you are positive, you will partner and will need to be treated.  Take pain medications as directed for break through pain. Do not drive or operate machinery while taking this medication.   Return to the Emergency Department immediately if you experience any worsening abdominal pain, fever, persistent nausea and vomiting, inability keep any food down, pain with urination, blood in your urine or any other worsening or concerning symptoms.

## 2021-04-04 NOTE — ED Triage Notes (Signed)
Pt reports lower abdominal pain and diarrhea that began last night. Denies N/V and rectal bleeding. Pt denies trouble urinating and abnormal vaginal discharge.

## 2021-04-04 NOTE — ED Provider Notes (Signed)
[facility] Provider Note   CSN: [id number] Arrival date & time: 04/04/21  1622     History Chief Complaint  Patient presents with   Abdominal Pain    Leah Rivera is a 25 y.o. female past ministry of BV, chlamydia who presents for evaluation of generalized abdominal pain, diarrhea that started last night.  She reports last night, she ate some chicken, gravy, rice, corn and states about 30 minutes after, she started having some abdominal pain.  She thought initially was just gas pain.  She states that it continued throughout today.  She states it is worse across her lower abdomen but occasionally is on the top part as well.  She states it is a sharp pain that gets worse when she has a bowel movement.  She estimates she has had about 6 episodes of loose stool since onset of symptoms last night.  No blood in stools.  No nausea/vomiting.  She has not had any fever, dysuria, hematuria.  She is currently on her menstrual cycle.  She has not had any vaginal discharge, vaginal bleeding.  She has not been on any recent antibiotics.  There is nobody else who ate the food.  The history is provided by the patient.      Past Medical History:  Diagnosis Date   Medical history non-contributory     Patient Active Problem List   Diagnosis Date Noted   BV (bacterial vaginosis) 08/02/2019   Chlamydia 08/02/2019   Vaginal discharge 02/02/2014   Encounter for counseling regarding contraception 02/02/2014    Past Surgical History:  Procedure Laterality Date   NO PAST SURGERIES     TOOTH EXTRACTION       OB History     Gravida  0   Para  0   Term  0   Preterm  0   AB  0   Living  0      SAB  0   IAB  0   Ectopic  0   Multiple  0   Live Births  0           History reviewed. No pertinent family history.  Social History   Tobacco Use   Smoking status: Former    Packs/day: 2.00    Types: Cigarettes   Smokeless tobacco: Never   Vaping Use   Vaping Use: Never used  Substance Use Topics   Alcohol use: Yes    Comment: occ   Drug use: Yes    Types: Marijuana    Comment: every day    Home Medications Prior to Admission medications   Medication Sig Start Date End Date Taking? Authorizing Provider  HYDROcodone-acetaminophen (NORCO/VICODIN) 5-325 MG tablet Take 1 tablet by mouth every 6 (six) hours as needed. 04/04/21  Yes Maxwell Caul, PA-C  metroNIDAZOLE (FLAGYL) 500 MG tablet Take 1 tablet (500 mg total) by mouth 2 (two) times daily. 04/04/21  Yes Graciella Freer A, PA-C  BORIC ACID EX Apply 1 application topically.    [provider]  clindamycin (CLEOCIN) 300 MG capsule Take 1 capsule (300 mg total) by mouth 2 (two) times daily. For seven days 10/26/19   Anyanwu, Jethro Bastos, MD  fluconazole (DIFLUCAN) 150 MG tablet Take 1 tablet (150 mg total) by mouth every 3 (three) days. For three doses 10/26/19   Anyanwu, Jethro Bastos, MD    Allergies    Patient has no known allergies.  Review of Systems   Review of  Systems  Constitutional:  Negative for fever.  Respiratory:  Negative for cough and shortness of breath.   Cardiovascular:  Negative for chest pain.  Gastrointestinal:  Positive for abdominal pain and diarrhea. Negative for nausea and vomiting.  Genitourinary:  Negative for dysuria and hematuria.  Neurological:  Negative for headaches.  All other systems reviewed and are negative.  Physical Exam Updated Vital Signs BP (!) 116/55   Pulse 69   Temp 98.9 F (37.2 C) (Oral)   Resp 18   Ht 5\' 9"  (1.753 m)   Wt 69.4 kg   LMP 03/31/2021 Comment: neg upt  SpO2 99%   BMI 22.59 kg/m   Physical Exam Vitals and nursing note reviewed. Exam conducted with a chaperone present.  Constitutional:      Appearance: Normal appearance. She is well-developed.  HENT:     Head: Normocephalic and atraumatic.  Eyes:     General: Lids are normal.     Conjunctiva/sclera: Conjunctivae normal.     Pupils: Pupils  are equal, round, and reactive to light.  Cardiovascular:     Rate and Rhythm: Normal rate and regular rhythm.     Pulses: Normal pulses.     Heart sounds: Normal heart sounds. No murmur heard.   No friction rub. No gallop.  Pulmonary:     Effort: Pulmonary effort is normal.     Breath sounds: Normal breath sounds.     Comments: Lungs clear to auscultation bilaterally.  Symmetric chest rise.  No wheezing, rales, rhonchi. Abdominal:     Palpations: Abdomen is soft. Abdomen is not rigid.     Tenderness: There is abdominal tenderness in the right lower quadrant, epigastric area, suprapubic area and left lower quadrant. There is no guarding.     Comments: Abdomen is soft, nondistended.  Generalized tenderness in lower abdomen.  No focal tenderness noted at McBurney's point.  Mild tenderness noted to the epigastric region as well.  No rigidity, guarding.  Genitourinary:    Comments: The exam was performed with a chaperone present. Normal external female genitalia. No lesions, rash, or sores.  Small amount of vaginal bleeding noted.  No hemorrhage.  Small amount of discharge noted.  No CMT.  No adnexal mass or tenderness noted bilaterally. Musculoskeletal:        General: Normal range of motion.     Cervical back: Full passive range of motion without pain.  Skin:    General: Skin is warm and dry.     Capillary Refill: Capillary refill takes less than 2 seconds.  Neurological:     Mental Status: She is alert and oriented to person, place, and time.  Psychiatric:        Speech: Speech normal.     ED Results / Procedures / Treatments   Labs (all labs ordered are listed, but only abnormal results are displayed) Labs Reviewed  WET PREP, GENITAL - Abnormal; Notable for the following components:      Result Value   Clue Cells Wet Prep HPF POC PRESENT (*)    WBC, Wet Prep HPF POC MODERATE (*)    All other components within normal limits  COMPREHENSIVE METABOLIC PANEL - Abnormal; Notable for  the following components:   Glucose, Bld 106 (*)    Calcium 10.5 (*)    Total Protein 8.9 (*)    All other components within normal limits  CBC WITH DIFFERENTIAL/PLATELET - Abnormal; Notable for the following components:   WBC 13.6 (*)    Neutro  Abs 11.8 (*)    All other components within normal limits  URINALYSIS, ROUTINE W REFLEX MICROSCOPIC - Abnormal; Notable for the following components:   APPearance HAZY (*)    Hgb urine dipstick MODERATE (*)    All other components within normal limits  LIPASE, BLOOD  I-STAT BETA HCG BLOOD, ED ([facility], [facility], [facility] ONLY)  GC/CHLAMYDIA PROBE AMP ([place] NOT [facility]    EKG None  Radiology CT Abdomen Pelvis W Contrast  Result Date: 04/04/2021 CLINICAL DATA:  Abdominal pain.  Diarrhea. EXAM: CT ABDOMEN AND PELVIS WITH CONTRAST TECHNIQUE: Multidetector CT imaging of the abdomen and pelvis was performed using the standard protocol following bolus administration of intravenous contrast. CONTRAST:  80mL OMNIPAQUE IOHEXOL 350 MG/ML SOLN COMPARISON:  None. FINDINGS: Lower chest: No acute abnormality. Hepatobiliary: No focal liver abnormality is seen. No gallstones, gallbladder wall thickening, or biliary dilatation. Pancreas: Unremarkable. No pancreatic ductal dilatation or surrounding inflammatory changes. Spleen: Normal in size without focal abnormality. Adrenals/Urinary Tract: Adrenal glands are unremarkable. Kidneys are normal, without renal calculi, focal lesion, or hydronephrosis. Bladder is unremarkable. Stomach/Bowel: Stomach is within normal limits. Appendix appears normal. No evidence of bowel wall thickening, distention, or inflammatory changes. Vascular/Lymphatic: No significant vascular findings are present. No enlarged abdominal or pelvic lymph nodes. Reproductive: Normal appearance of the ovaries. 1.8 cm subserosal myometrial mass in the right hand side of the uterine body. Other: No abdominal wall hernia or abnormality. No abdominopelvic ascites.  Musculoskeletal: No acute or significant osseous findings. IMPRESSION: 1. No evidence of acute abnormalities within the abdomen or pelvis. 2. 1.8 cm subserosal myometrial mass within the uterine body. This may represent a fibroid. Further evaluation with nonemergent pelvic ultrasound may be considered, given patient's young age. Electronically Signed   By: Ted Mcalpineobrinka  Dimitrova M.D.   On: 04/04/2021 18:46    Procedures Procedures   Medications Ordered in ED Medications  sodium chloride (PF) 0.9 % injection (has no administration in time range)  morphine 4 MG/ML injection 4 mg (4 mg Intravenous Given 04/04/21 1701)  ondansetron (ZOFRAN) injection 4 mg (4 mg Intravenous Given 04/04/21 1701)  fentaNYL (SUBLIMAZE) injection 50 mcg (50 mcg Intravenous Given 04/04/21 1837)  iohexol (OMNIPAQUE) 350 MG/ML injection 80 mL (80 mLs Intravenous Contrast Given 04/04/21 1827)  ketorolac (TORADOL) 30 MG/ML injection 30 mg (30 mg Intravenous Given 04/04/21 1945)  HYDROmorphone (DILAUDID) injection 0.5 mg (0.5 mg Intravenous Given 04/04/21 2124)  HYDROcodone-acetaminophen (NORCO/VICODIN) 5-325 MG per tablet 1 tablet (1 tablet Oral Given 04/04/21 2320)    ED Course  I have reviewed the triage vital signs and the nursing notes.  Pertinent labs & imaging results that were available during my care of the patient were reviewed by me and considered in my medical decision making (see chart for details).    MDM Rules/Calculators/A&P                          25 year old female who presents for evaluation of generalized abdominal pain, diarrhea that began last night.  Reports eating chicken/rice, vegetables last night and reports 30 minutes after, started having symptoms.  No fevers, vomiting.  No urinary complaints.  No blood in stool.  On initial arrival, she is afebrile, toxic appearing.  Vital signs are stable.  On exam, she has diffuse abdominal tenderness in lower abdomen with no focal McBurney point tenderness.   Suspect this is likely foodborne illness/gastroenteritis.  Low suspicion for diverticulitis, appendicitis.  Do not suspect  ovarian torsion.  We will plan to check labs, give analgesics and reassess.  CBC shows leukocytosis of 13.6. I-stat beta is negative.  Lipase is unremarkable.  I-STAT beta is negative.  CMP shows normal BUN and creatinine.  UA shows moderate hemoglobin.  No infectious etiology.  Patient is currently on her menstrual cycle which likely explains hemoglobin.  Re-evaluation. Patient reports she is still having pain. Currently rates it 8/10. Repeat abdominal exam shows she is still tender in the lower abdomen. Will give additional analgesics. Given her persistent pain, will obtain imaging.   CT scan shows no evidence of appendicitis. There is a 1.8 cm subserosal myometrial mass in the uterus that may represent a fibroid. Patient is currently on her menstrual cycle, which could contribute to pain.  I discussed results with patient.  She still having some pain.  I discussed with her that the uterine fibroid could be causing Tori of her symptoms.  Especially now that she is on her menstrual cycle.  Did offer her pelvic exam patient is agreeable.  Pelvic exam as document above.  No CMT that would be concerning for PID.  No adnexal mass or tenderness bilaterally.  At this time, given reassuring pulm exam, do not feel that she is having ovarian torsion.  She also has no adnexal abnormalities on her CT scan.  She still having some pain.  We will give her additional analgesics.  Wet prep positive for clue cells.  Patient has had BV before.  We will plan to treat.  Patient reports she is still having some pain.  Repeat abdominal exam shows she is diffusely tender but is improved.  We will plan to send her home with some analgesics.  We will also treat her for BV.  I discussed the patient that she has gonorrhea chlamydia cultures pending. Will give her outpatient OB/Gyn follow up. At this time,  patient exhibits no emergent life-threatening condition that require further evaluation in ED. Patient had ample opportunity for questions and discussion. All patient's questions were answered with full understanding. Patient had ample opportunity for questions and discussion. All patient's questions were answered with full understanding.  Portions of this note were generated with Scientist, clinical (histocompatibility and immunogenetics). Dictation errors may occur despite best attempts at proofreading.   Final Clinical Impression(s) / ED Diagnoses Final diagnoses:  Generalized abdominal pain  BV (bacterial vaginosis)    Rx / DC Orders ED Discharge Orders          Ordered    metroNIDAZOLE (FLAGYL) 500 MG tablet  2 times daily        04/04/21 2303    HYDROcodone-acetaminophen (NORCO/VICODIN) 5-325 MG tablet  Every 6 hours PRN        04/04/21 2303             Rosana Hoes 04/04/21 2354    Mancel Bale, MD 04/07/21 0007

## 2021-04-05 NOTE — ED Provider Notes (Signed)
The patient called to state that the narcotic prescription was not sent to the pharmacy.  According to the records, it appears that it was sent electronically.  [facility] [address] and they state that they have the 2 prescriptions ready since yesterday.  [facility].   Mancel Bale, MD 04/05/21 1723

## 2021-04-06 ENCOUNTER — Ambulatory Visit: Payer: Self-pay | Admitting: Emergency Medicine

## 2021-04-06 ENCOUNTER — Emergency Department (HOSPITAL_COMMUNITY)
Admission: EM | Admit: 2021-04-06 | Discharge: 2021-04-07 | Disposition: A | Discharge status: Home / Self Care | Payer: Self-pay | Attending: Emergency Medicine | Admitting: Emergency Medicine

## 2021-04-06 ENCOUNTER — Telehealth: Payer: Medicaid Other | Admitting: Physician Assistant

## 2021-04-06 ENCOUNTER — Encounter (HOSPITAL_COMMUNITY): Payer: Self-pay

## 2021-04-06 ENCOUNTER — Other Ambulatory Visit: Payer: Self-pay

## 2021-04-06 ENCOUNTER — Encounter: Payer: Self-pay | Admitting: Physician Assistant

## 2021-04-06 ENCOUNTER — Emergency Department (HOSPITAL_COMMUNITY): Payer: Self-pay

## 2021-04-06 DIAGNOSIS — A549 Gonococcal infection, unspecified: Secondary | ICD-10-CM

## 2021-04-06 DIAGNOSIS — Z87891 Personal history of nicotine dependence: Secondary | ICD-10-CM | POA: Insufficient documentation

## 2021-04-06 DIAGNOSIS — N730 Acute parametritis and pelvic cellulitis: Secondary | ICD-10-CM

## 2021-04-06 DIAGNOSIS — N739 Female pelvic inflammatory disease, unspecified: Secondary | ICD-10-CM | POA: Insufficient documentation

## 2021-04-06 DIAGNOSIS — N898 Other specified noninflammatory disorders of vagina: Secondary | ICD-10-CM | POA: Insufficient documentation

## 2021-04-06 LAB — URINALYSIS, ROUTINE W REFLEX MICROSCOPIC
Bilirubin Urine: NEGATIVE
Glucose, UA: NEGATIVE mg/dL
Ketones, ur: 20 mg/dL — AB
Nitrite: NEGATIVE
Protein, ur: 30 mg/dL — AB
Specific Gravity, Urine: 1.02 (ref 1.005–1.030)
pH: 5 (ref 5.0–8.0)

## 2021-04-06 LAB — CBC WITH DIFFERENTIAL/PLATELET
Abs Immature Granulocytes: 0.04 10*3/uL (ref 0.00–0.07)
Basophils Absolute: 0 10*3/uL (ref 0.0–0.1)
Basophils Relative: 0 %
Eosinophils Absolute: 0.1 10*3/uL (ref 0.0–0.5)
Eosinophils Relative: 1 %
HCT: 37.2 % (ref 36.0–46.0)
Hemoglobin: 11.8 g/dL — ABNORMAL LOW (ref 12.0–15.0)
Immature Granulocytes: 0 %
Lymphocytes Relative: 9 %
Lymphs Abs: 0.9 10*3/uL (ref 0.7–4.0)
MCH: 29.4 pg (ref 26.0–34.0)
MCHC: 31.7 g/dL (ref 30.0–36.0)
MCV: 92.5 fL (ref 80.0–100.0)
Monocytes Absolute: 0.4 10*3/uL (ref 0.1–1.0)
Monocytes Relative: 4 %
Neutro Abs: 8.6 10*3/uL — ABNORMAL HIGH (ref 1.7–7.7)
Neutrophils Relative %: 86 %
Platelets: 234 10*3/uL (ref 150–400)
RBC: 4.02 MIL/uL (ref 3.87–5.11)
RDW: 13.3 % (ref 11.5–15.5)
WBC: 10.1 10*3/uL (ref 4.0–10.5)
nRBC: 0 % (ref 0.0–0.2)

## 2021-04-06 LAB — I-STAT BETA HCG BLOOD, ED (MC, WL, AP ONLY): I-stat hCG, quantitative: 6.3 m[IU]/mL — ABNORMAL HIGH (ref <5)

## 2021-04-06 LAB — GC/CHLAMYDIA PROBE AMP (~~LOC~~) NOT AT ARMC
Chlamydia: NEGATIVE
Comment: NEGATIVE
Comment: NORMAL
Neisseria Gonorrhea: POSITIVE — AB

## 2021-04-06 LAB — BASIC METABOLIC PANEL
Anion gap: 10 (ref 5–15)
BUN: 8 mg/dL (ref 6–20)
CO2: 24 mmol/L (ref 22–32)
Calcium: 9.5 mg/dL (ref 8.9–10.3)
Chloride: 100 mmol/L (ref 98–111)
Creatinine, Ser: 0.89 mg/dL (ref 0.44–1.00)
GFR, Estimated: >60 mL/min (ref >60)
Glucose, Bld: 85 mg/dL (ref 70–99)
Potassium: 3.3 mmol/L — ABNORMAL LOW (ref 3.5–5.1)
Sodium: 134 mmol/L — ABNORMAL LOW (ref 135–145)

## 2021-04-06 MED ORDER — HYDROMORPHONE HCL 1 MG/ML IJ SOLN
0.5000 mg | Freq: Once | INTRAMUSCULAR | Status: AC
  Administered 2021-04-06: 0.5 mg via INTRAVENOUS
  Filled 2021-04-06: qty 1

## 2021-04-06 MED ORDER — FENTANYL CITRATE (PF) 100 MCG/2ML IJ SOLN
50.0000 ug | Freq: Once | INTRAMUSCULAR | Status: AC
  Administered 2021-04-06: 50 ug via INTRAVENOUS
  Filled 2021-04-06: qty 2

## 2021-04-06 MED ORDER — SODIUM CHLORIDE 0.9 % IV SOLN
1.0000 g | Freq: Once | INTRAVENOUS | Status: AC
  Administered 2021-04-06: 1 g via INTRAVENOUS
  Filled 2021-04-06: qty 10

## 2021-04-06 MED ORDER — KETOROLAC TROMETHAMINE 30 MG/ML IJ SOLN
15.0000 mg | Freq: Once | INTRAMUSCULAR | Status: AC
  Administered 2021-04-06: 15 mg via INTRAVENOUS
  Filled 2021-04-06: qty 1

## 2021-04-06 NOTE — Progress Notes (Signed)
Ms. Leah Rivera, kluender are scheduled for a virtual visit with your provider today.    Just as we do with appointments in the office, we must obtain your consent to participate.  Your consent will be active for this visit and any virtual visit you may have with one of our providers in the next 365 days.    If you have a MyChart account, I can also send a copy of this consent to you electronically.  All virtual visits are billed to your insurance company just like a traditional visit in the office.  As this is a virtual visit, video technology does not allow for your provider to perform a traditional examination.  This may limit your provider's ability to fully assess your condition.  If your provider identifies any concerns that need to be evaluated in person or the need to arrange testing such as labs, EKG, etc, we will make arrangements to do so.    Although advances in technology are sophisticated, we cannot ensure that it will always work on either your end or our end.  If the connection with a video visit is poor, we may have to switch to a telephone visit.  With either a video or telephone visit, we are not always able to ensure that we have a secure connection.   I need to obtain your verbal consent now.   Are you willing to proceed with your visit today?   [name] has provided verbal consent on 04/06/2021 for a virtual visit (video or telephone).   [name], PA-C 04/06/2021  5:41 PM  Virtual Visit Consent   [name], you are scheduled for a virtual visit with a [facility] provider today.     Just as with appointments in the office, your consent must be obtained to participate.  Your consent will be active for this visit and any virtual visit you may have with one of our providers in the next 365 days.     If you have a MyChart account, a copy of this consent can be sent to you electronically.  All virtual visits are billed to your insurance company just like a traditional visit  in the office.    As this is a virtual visit, video technology does not allow for your provider to perform a traditional examination.  This may limit your provider's ability to fully assess your condition.  If your provider identifies any concerns that need to be evaluated in person or the need to arrange testing (such as labs, EKG, etc.), we will make arrangements to do so.     Although advances in technology are sophisticated, we cannot ensure that it will always work on either your end or our end.  If the connection with a video visit is poor, the visit may have to be switched to a telephone visit.  With either a video or telephone visit, we are not always able to ensure that we have a secure connection.     I need to obtain your verbal consent now.   Are you willing to proceed with your visit today?    [name] has provided verbal consent on 04/06/2021 for a virtual visit (video or telephone).   [name], PA-C   Date: 04/06/2021 5:41 PM   Virtual Visit via Video Note   I, [name], connected with  [name]  ([id number], 09-07-1996) on 04/06/21 at  5:15 PM EDT by a video-enabled telemedicine application and verified that I  am speaking with the correct person using two identifiers.  Location: Patient: Virtual Visit Location Patient: Home Provider: Virtual Visit Location Provider: Home Office   I discussed the limitations of evaluation and management by telemedicine and the availability of in person appointments. The patient expressed understanding and agreed to proceed.    History of Present Illness: Leah Rivera is a 25 y.o. who identifies as a female who was assigned female at birth, and is being seen today for stomach pain.  HPI: Abdominal Pain This is a new problem. The current episode started in the past 7 days. The onset quality is sudden. The problem occurs constantly. The problem has been unchanged. The pain is located in the suprapubic region, LLQ  and RLQ. The pain is at a severity of 8/10. The pain is severe. The quality of the pain is aching and sharp. Associated symptoms include diarrhea, frequency, myalgias and nausea. Pertinent negatives include no dysuria, fever, hematochezia, hematuria or vomiting. The pain is aggravated by certain positions, bowel movement, eating, movement and urination. The pain is relieved by Nothing. She has tried acetaminophen, antibiotics and oral narcotic analgesics for the symptoms. The treatment provided no relief. Prior diagnostic workup includes CT scan.    Patient was seen on 04/04/21 at [facility] ER for same issue. She had labs, pap, wet prep, STD screen, and CT abd/pelvis completed. CT was significant for uterine fibroid (patient was also on menstrual cycle). Felt this may be source of pain. Wet prep was positive for clue cells and treated for BV also. Patient did have elevated WBC count as well. She was given narcotic pain medication for the pelvic pain. This is not helping her pain.   Upon review of labs today, STD testing was positive for gonorrhea. High suspicion for PID now since her pain is so intense.   Problems:  Patient Active Problem List   Diagnosis Date Noted   BV (bacterial vaginosis) 08/02/2019   Chlamydia 08/02/2019   Vaginal discharge 02/02/2014   Encounter for counseling regarding contraception 02/02/2014    Allergies: No Known Allergies Medications:  Current Outpatient Medications:    BORIC ACID EX, Apply 1 application topically., Disp: , Rfl:    clindamycin (CLEOCIN) 300 MG capsule, Take 1 capsule (300 mg total) by mouth 2 (two) times daily. For seven days, Disp: 14 capsule, Rfl: 2   fluconazole (DIFLUCAN) 150 MG tablet, Take 1 tablet (150 mg total) by mouth every 3 (three) days. For three doses, Disp: 3 tablet, Rfl: 3   HYDROcodone-acetaminophen (NORCO/VICODIN) 5-325 MG tablet, Take 1 tablet by mouth every 6 (six) hours as needed., Disp: 6 tablet, Rfl: 0   metroNIDAZOLE (FLAGYL)  500 MG tablet, Take 1 tablet (500 mg total) by mouth 2 (two) times daily., Disp: 14 tablet, Rfl: 0  Observations/Objective: Patient is well-developed, well-nourished in no acute distress. Appears uncomfortable. Resting comfortably at home.  Head is normocephalic, atraumatic.  No labored breathing.  Speech is clear and coherent with logical content.  Patient is alert and oriented at baseline.    Assessment and Plan: 1. Gonorrhea  2. PID (acute pelvic inflammatory disease)  -Discussed gonorrhea diagnosis and treatment options (oral vs IM) - Did discuss high suspicion for PID as well - Patient and I agree she would be better evaluated at the ED or UC again to receive immediate treatment and receive pain management  Follow Up Instructions: I discussed the assessment and treatment plan with the patient. The patient was provided an opportunity to ask  questions and all were answered. The patient agreed with the plan and demonstrated an understanding of the instructions.  A copy of instructions were sent to the patient via MyChart.  The patient was advised to call back or seek an in-person evaluation if the symptoms worsen or if the condition fails to improve as anticipated.  Time:  I spent 12 minutes with the patient via telehealth technology discussing the above problems/concerns.    [name], PA-C

## 2021-04-06 NOTE — ED Provider Notes (Addendum)
[facility]-EMERGENCY DEPT Provider Note   CSN: [id number] Arrival date & time: 04/06/21  1801     History No chief complaint on file.   Leah Rivera is a 25 y.o. female who was recently screened and tested positive for chlamydia who presents today with request for treatment  as well as reevaluation of her persistent abdominal pain.  Upon chart review, patient was evaluated in the emergency department on 7/16 at which time she had normal CT evaluation of the abdomen pelvis with the exception of a uterine fibroid.  She improved after administration of IV analgesia and antiemetic and was discharged with prescription for hydrocodone as well as Flagyl for bacterial vaginosis.  Pain is now localized to the left lower quadrant, states she has not had a bowel movement since beginning to take hydrocodone, feels she is constipated.  Does continue to pass flatus.  LMP ended 2 days ago, she is compliant with daily OCPs.  I personally reviewed the patient's medical records.  She is history of BV and abdominal pain, history of chlamydia in the past.  She is not on any medications every day.  HPI     Past Medical History:  Diagnosis Date   Medical history non-contributory     Patient Active Problem List   Diagnosis Date Noted   BV (bacterial vaginosis) 08/02/2019   Chlamydia 08/02/2019   Vaginal discharge 02/02/2014   Encounter for counseling regarding contraception 02/02/2014    Past Surgical History:  Procedure Laterality Date   NO PAST SURGERIES     TOOTH EXTRACTION       OB History     Gravida  0   Para  0   Term  0   Preterm  0   AB  0   Living  0      SAB  0   IAB  0   Ectopic  0   Multiple  0   Live Births  0           Family History  Problem Relation Age of Onset   Healthy Mother    Healthy Father     Social History   Tobacco Use   Smoking status: Former    Packs/day: 2.00    Types: Cigarettes   Smokeless tobacco:  Never  Vaping Use   Vaping Use: Never used  Substance Use Topics   Alcohol use: Yes    Comment: occ   Drug use: Yes    Types: Marijuana    Comment: every day    Home Medications Prior to Admission medications   Medication Sig Start Date End Date Taking? Authorizing Provider  BORIC ACID EX Apply 1 application topically.    [provider]  clindamycin (CLEOCIN) 300 MG capsule Take 1 capsule (300 mg total) by mouth 2 (two) times daily. For seven days 10/26/19   Anyanwu, Jethro BastosUgonna A, MD  fluconazole (DIFLUCAN) 150 MG tablet Take 1 tablet (150 mg total) by mouth every 3 (three) days. For three doses 10/26/19   Anyanwu, Jethro BastosUgonna A, MD  HYDROcodone-acetaminophen (NORCO/VICODIN) 5-325 MG tablet Take 1 tablet by mouth every 6 (six) hours as needed. 04/04/21   Maxwell CaulLayden, Lindsey A, PA-C  metroNIDAZOLE (FLAGYL) 500 MG tablet Take 1 tablet (500 mg total) by mouth 2 (two) times daily. 04/04/21   Maxwell CaulLayden, Lindsey A, PA-C    Allergies    Patient has no known allergies.  Review of Systems   Review of Systems  Constitutional: Negative.  HENT: Negative.    Respiratory: Negative.    Cardiovascular: Negative.   Gastrointestinal:  Positive for abdominal pain, constipation and nausea. Negative for vomiting.  Genitourinary:  Positive for vaginal discharge. Negative for decreased urine volume, difficulty urinating, dysuria, enuresis, flank pain, frequency, genital sores, hematuria, menstrual problem, pelvic pain, urgency, vaginal bleeding and vaginal pain.  Musculoskeletal: Negative.   Skin: Negative.   Neurological: Negative.    Physical Exam Updated Vital Signs BP 125/80   Pulse 83   Temp 98.7 F (37.1 C) (Oral)   Resp 18   Ht 5\' 9"  (1.753 m)   Wt 69.4 kg   LMP 03/31/2021 (Approximate) Comment: neg upt  SpO2 100%   BMI 22.59 kg/m   Physical Exam Vitals and nursing note reviewed.  Constitutional:      Appearance: She is not toxic-appearing.  HENT:     Head: Normocephalic and atraumatic.      Nose: Nose normal. No congestion.     Mouth/Throat:     Mouth: Mucous membranes are moist.     Pharynx: No oropharyngeal exudate or posterior oropharyngeal erythema.  Eyes:     General:        Right eye: No discharge.        Left eye: No discharge.     Extraocular Movements: Extraocular movements intact.     Conjunctiva/sclera: Conjunctivae normal.     Pupils: Pupils are equal, round, and reactive to light.  Cardiovascular:     Rate and Rhythm: Normal rate and regular rhythm.     Pulses: Normal pulses.     Heart sounds: Normal heart sounds. No murmur heard. Pulmonary:     Effort: Pulmonary effort is normal. No respiratory distress.     Breath sounds: Normal breath sounds. No wheezing or rales.  Abdominal:     General: Bowel sounds are normal. There is no distension.     Palpations: Abdomen is soft.     Tenderness: There is abdominal tenderness in the right lower quadrant, suprapubic area and left lower quadrant. There is no right CVA tenderness, left CVA tenderness, guarding or rebound. Negative signs include Murphy's sign, Rovsing's sign and McBurney's sign.  Genitourinary:    General: Normal vulva.     Exam position: Lithotomy position.     Pubic Area: No rash.      Labia:        Right: No rash.      Vagina: Vaginal discharge present. No erythema or tenderness.     Cervix: Normal.     Uterus: Normal.      Adnexa: Right adnexa normal.     Musculoskeletal:        General: No deformity.     Cervical back: Neck supple.     Right lower leg: No edema.     Left lower leg: No edema.  Skin:    General: Skin is warm and dry.     Capillary Refill: Capillary refill takes less than 2 seconds.     Findings: No rash.  Neurological:     General: No focal deficit present.     Mental Status: She is alert and oriented to person, place, and time. Mental status is at baseline.  Psychiatric:        Mood and Affect: Mood normal.    ED Results / Procedures / Treatments   Labs (all  labs ordered are listed, but only abnormal results are displayed) Labs Reviewed  CBC WITH DIFFERENTIAL/PLATELET - Abnormal; Notable for the following  components:      Result Value   Hemoglobin 11.8 (*)    Neutro Abs 8.6 (*)    All other components within normal limits  BASIC METABOLIC PANEL - Abnormal; Notable for the following components:   Sodium 134 (*)    Potassium 3.3 (*)    All other components within normal limits  URINALYSIS, ROUTINE W REFLEX MICROSCOPIC - Abnormal; Notable for the following components:   APPearance HAZY (*)    Hgb urine dipstick SMALL (*)    Ketones, ur 20 (*)    Protein, ur 30 (*)    Leukocytes,Ua SMALL (*)    Bacteria, UA RARE (*)    All other components within normal limits  I-STAT BETA HCG BLOOD, ED ([facility], [facility], [facility] ONLY) - Abnormal; Notable for the following components:   I-stat hCG, quantitative 6.3 (*)    All other components within normal limits    EKG None  Radiology US PELVIC COMPLETE WITH TRANSVAGINAL  Result Date: 04/06/2021 CLINICAL DATA:  Uterine mass on previous CT EXAM: TRANSABDOMINAL AND TRANSVAGINAL ULTRASOUND OF PELVIS TECHNIQUE: Both transabdominal and transvaginal ultrasound examinations of the pelvis were performed. Transabdominal technique was performed for global imaging of the pelvis including uterus, ovaries, adnexal regions, and pelvic cul-de-sac. It was necessary to proceed with endovaginal exam following the transabdominal exam to visualize the endometrium and adnexal structures. COMPARISON:  04/04/2021 FINDINGS: Uterus Measurements: 6.4 x 2.9 x 4.4 cm = volume: 42.0 mL. There is a 2.3 x 1.8 x 2.0 cm hypoechoic mass exophytic off the right posterolateral aspect of the uterine fundus, consistent with fibroid. This corresponds to previous CT finding. No additional uterine abnormalities. Endometrium Thickness: 1 mm.  No focal abnormality visualized. Right ovary Measurements: 3.8 x 2.5 x 1.7 cm = volume: 8.3 mL. Normal appearance/no adnexal  mass. Left ovary Measurements: 3.1 x 1.6 x 1.8 cm = volume: 4.6 mL. Normal appearance/no adnexal mass. Other findings Trace pelvic free fluid is likely physiologic. IMPRESSION: 1. 2.3 cm uterine fibroid off the right posterolateral aspect of the uterine fundus. This corresponds to CT finding. 2. Otherwise unremarkable age-appropriate pelvic ultrasound. Electronically Signed   By: Sharlet Salina M.D.   On: 04/06/2021 20:59    Procedures Procedures   Medications Ordered in ED Medications  fentaNYL (SUBLIMAZE) injection 50 mcg (50 mcg Intravenous Given 04/06/21 2001)  cefTRIAXone (ROCEPHIN) 1 g in sodium chloride 0.9 % 100 mL IVPB (1 g Intravenous New Bag/Given 04/06/21 2238)  HYDROmorphone (DILAUDID) injection 0.5 mg (0.5 mg Intravenous Given 04/06/21 2237)  ketorolac (TORADOL) 30 MG/ML injection 15 mg (15 mg Intravenous Given 04/06/21 2237)    ED Course  I have reviewed the triage vital signs and the nursing notes.  Pertinent labs & imaging results that were available during my care of the patient were reviewed by me and considered in my medical decision making (see chart for details).    MDM Rules/Calculators/A&P                         25 year old female who presents with concern for positive chlamydia test as well as abdominal pain.  Differential diagnose includes but not limited to STD, PID, UTI, constipation, gastroenteritis, ectopic pregnancy, ovarian torsion or cyst.  Vitals are normal intake.  Cardiopulmonary exam is normal, abdominal exam is significant for bilateral lower abdominal tenderness to palpation without rebound or guarding.  Worse in the LLQ.  GU exam performed in the presence of chaperone revealed some thick  white vaginal discharge in the vaginal vault without CMT or adnexal fullness or tenderness palpation bilaterally.  Basic laboratory studies were obtained.  CBC without leukocytosis, BMP with mild hypokalemia, hCG very mildly positive to 6.3, however just completed her  menstrual cycle days ago.  UA with small hemoglobin, ketonuria, proteinuria, and leukocytes in the urine.  Did not repeat GC/chlamydia.  Upon chart review, patient did not test positive for chlamydia, she tested negative for committee.  She did test positive for gonorrhea.  Will administer Rocephin and IV antiemetic and analgesia.    Pelvic ultrasound obtained given persistent abdominal pain in context of sexual transmitted infection which was negative for any acute pelvic abnormality.  No adnexal fullness or mass bilaterally.  Again visualized 2.3 cm uterine fibroid.  Overall very reassuring image.  Patient reevaluated with improvement in her symptoms after IV analgesia.  Will be discharged home after IV antibiotics.  Recommend OB/GYN follow-up in the outpatient setting.  No further work-up warranted in the this time.  Patient signed out to oncoming ED provider Leah McDonald, PA-C at time of shift change.  Pending completion of her antibiotics at this time.  If pain is well managed after completion of antibiotics, may be discharged home.  Suspect abdominal pain contributed to by constipation secondary to narcotics.  Will recommend OTC stool softeners.  I appreciate her collaboration care of this patient. Leah Rivera voiced understanding of her medical evaluation to include.  She was her questions answered to their expressed satisfaction.  Return precautions given.    This chart was dictated using voice recognition software, Dragon. Despite the best efforts of this provider to proofread and correct errors, errors may still occur which can change documentation meaning.   Final Clinical Impression(s) / ED Diagnoses Final diagnoses:  PID (pelvic inflammatory disease)    Rx / DC Orders ED Discharge Orders     None        Paris Lore, PA-C 04/06/21 2319    Damani Kelemen, Eugene Gavia, PA-C 04/06/21 2321    Tilden Fossa, MD 04/06/21 [phone]

## 2021-04-06 NOTE — ED Provider Notes (Signed)
25 year old female received in signout from Leah Rivera pending re-evaluation after antibiotics.  Per her HPI:  "Leah Rivera is a 25 y.o. female who was recently screened and tested positive for chlamydia who presents today with request for treatment  as well as reevaluation of her persistent abdominal pain.  Upon chart review, patient was evaluated in the emergency department on 7/16 at which time she had normal CT evaluation of the abdomen pelvis with the exception of a uterine fibroid.  She improved after administration of IV analgesia and antiemetic and was discharged with prescription for hydrocodone as well as Flagyl for bacterial vaginosis.   Pain is now localized to the left lower quadrant, states she has not had a bowel movement since beginning to take hydrocodone, feels she is constipated.  Does continue to pass flatus.  LMP ended 2 days ago, she is compliant with daily OCPs.   I personally reviewed the patient's medical records.  She is history of BV and abdominal pain, history of chlamydia in the past.  She is not on any medications every day."  Physical Exam  BP 125/83   Pulse 100   Temp 98.7 F (37.1 C) (Oral)   Resp 18   Ht 5\' 9"  (1.753 m)   Wt 69.4 kg   LMP 03/31/2021 (Approximate) Comment: neg upt  SpO2 100%   BMI 22.59 kg/m   Physical Exam Vitals and nursing note reviewed.  Constitutional:      Appearance: She is well-developed. She is not ill-appearing.     Comments: NAD  HENT:     Head: Normocephalic and atraumatic.  Musculoskeletal:        General: No swelling.     Cervical back: Normal range of motion.  Neurological:     Mental Status: She is alert and oriented to person, place, and time.    ED Course/Procedures     Procedures  MDM  25 year old female received in signout from 25 Rivera pending reevaluation after antibiotics.  Please see her note for further work-up and medical decision making.  On reevaluation, patient is feeling much improved.   She was treated for gonorrhea with Rocephin.  Chlamydia testing was negative.  She is already being treated with Flagyl for BV.  Pain is well controlled.  Vital signs are stable.  She did have an i-STAT hCG of 6.5, but just had a normal menstrual cycle.  She has no concern for pregnancy.  No evidence of TOA or PID.  She can be discharged home with supportive care, and advised to finish her home course of Flagyl.  She will follow-up outpatient and be given a regimen for constipation.  She is hemodynamically stable and in no acute distress.  Safer discharge home with outpatient follow-up as discussed.  ER return precautions given.    [place], PA-C 04/06/21 2356    04/08/21, MD 04/07/21 [phone]

## 2021-04-06 NOTE — ED Triage Notes (Signed)
Patient reports that she did a virtual visit and was told she had gonorrhea. Patient was seen 2 days ago and reports that she is continuing to have lower abdominal pain. Patient states she is not sure if she is having vaginal discharge because she is finishing up her period.

## 2021-04-06 NOTE — Discharge Instructions (Addendum)
You were seen in the ER today for your abdominal pain and your positive gonorrhea test.  You administered IV antibiotics and pain medication.  Suspect your abdominal pain is being contributed to by your constipation likely from taking the hydrocodone.  Recommend you take Tylenol and ibuprofen as needed for your abdominal pain.  Please inform all of your sexual partners of your recent positive test.  Please follow-up closely in outpatient setting with your OB/GYN.  Regarding constipation, you may try over-the-counter MiraLAX powder, or magnesium citrate.  Return to the ER for develop any worsening abdominal pain, nausea or vomiting does not stop, or any other new severe symptom.

## 2021-04-06 NOTE — Patient Instructions (Addendum)
Velmer Christell Constant, thank you for joining [name], PA-C for today's virtual visit.  While this provider is not your primary care provider (PCP), if your PCP is located in our provider database this encounter information will be shared with them immediately following your visit.  Consent: (Patient) Leah Rivera provided verbal consent for this virtual visit at the beginning of the encounter.  Current Medications:  Current Outpatient Medications:    BORIC ACID EX, Apply 1 application topically., Disp: , Rfl:    clindamycin (CLEOCIN) 300 MG capsule, Take 1 capsule (300 mg total) by mouth 2 (two) times daily. For seven days, Disp: 14 capsule, Rfl: 2   fluconazole (DIFLUCAN) 150 MG tablet, Take 1 tablet (150 mg total) by mouth every 3 (three) days. For three doses, Disp: 3 tablet, Rfl: 3   HYDROcodone-acetaminophen (NORCO/VICODIN) 5-325 MG tablet, Take 1 tablet by mouth every 6 (six) hours as needed., Disp: 6 tablet, Rfl: 0   metroNIDAZOLE (FLAGYL) 500 MG tablet, Take 1 tablet (500 mg total) by mouth 2 (two) times daily., Disp: 14 tablet, Rfl: 0   Medications ordered in this encounter:  No orders of the defined types were placed in this encounter.    *If you need refills on other medications prior to your next appointment, please contact your pharmacy*  Follow-Up: Call back or seek an in-person evaluation if the symptoms worsen or if the condition fails to improve as anticipated.  Other Instructions See below   If you have been instructed to have an in-person evaluation today at a local Urgent Care facility, please use the link below. It will take you to a list of all of our available [facility], including address, phone number and hours of operation. Please do not delay care.  [facility]  If you or a family member do not have a primary care provider, use the link below to schedule a visit and establish care. When you choose a [facility] primary care  physician or advanced practice provider, you gain a long-term partner in health. Find a Primary Care Provider  Learn more about [facility]'s in-office and virtual care options: [facility] Get Care Now   Pelvic Inflammatory Disease  Pelvic inflammatory disease (PID) is caused by an infection in some or all of the female reproductive organs. The infection can be in the uterus, ovaries, fallopian tubes, or the surrounding tissues in the pelvis. PID can cause abdominal or pelvic pain that comes on suddenly (acute pelvic pain). PID is a serious infection because it can lead to lasting (chronic) pelvic pain or the inability to have children (infertility). What are the causes? This condition is most often caused by bacteria that is spread during sexual contact. It can also be caused by a bacterial infection of the vagina (bacterial vaginosis) that is not spread by sexual contact. This condition occurs when the infection is not treated and the bacteria travel upward from the vagina or cervix into the reproductive organs. Bacteria may also be introduced into the reproductive organs following: The birth of a baby. A miscarriage. An abortion. Major pelvic surgery. The insertion of an intrauterine device (IUD). A sexual assault. What increases the risk? You are more likely to develop this condition if you: Are younger than 25 years of age. Are sexually active at a young age. Have a history of STI (sexually transmitted infection) or PID. Do not regularly use barrier contraception methods, such as condoms. Have multiple sexual partners. Have  sex with someone who has symptoms of an STI. Use a vaginal douche. Have recently had an IUD inserted. What are the signs or symptoms? Symptoms of this condition include: Abdominal or pelvic pain. Fever. Chills. Abnormal vaginal discharge. Abnormal uterine bleeding. Unusual pain shortly after the end of a menstrual period. Painful urination. Pain with  sex. Nausea and vomiting. How is this diagnosed? This condition is diagnosed based on a pelvic exam and medical history. A pelvic exam can reveal signs of infection, inflammation, and discharge in the vagina and the surrounding tissues. It can also help to identify painful areas. You may also have tests, such as: Lab tests, including a pregnancy test, blood tests, and a urine test. Culture tests of the vagina and cervix to check for an STI. Ultrasound. A laparoscopic procedure to look inside the pelvis. Examination of vaginal discharge under a microscope. How is this treated? This condition may be treated with: Antibiotic medicines taken by mouth (orally). For more severe cases, antibiotics may be given through an IV at the hospital. Surgery. This is rare. Surgery may be needed if other treatments do not help. Efforts to stop the spread of the infection. Sexual partners may need to be treated if the infection is caused by an STI. It may take weeks until you are completely well. If you are diagnosed with PID, you should also be checked for HIV (human immunodeficiency virus). Your health care provider may test you for infection again 3 months aftertreatment. You should not have unprotected sex. Follow these instructions at home: Take over-the-counter and prescription medicines only as told by your health care provider. If you were prescribed an antibiotic medicine, take it as told by your health care provider. Do not stop using the antibiotic even if you start to feel better. Do not have sex until treatment is completed or as told by your health care provider. If PID is confirmed, your recent sexual partners will need treatment, especially if you had unprotected sex. Keep all follow-up visits as told by your health care provider. This is important. Contact a health care provider if: You have increased or abnormal vaginal discharge. Your pain does not improve. You vomit. You have a fever. You  cannot tolerate your medicines. Your partner has an STI. You have pain when you urinate. Get help right away if: You have increased abdominal or pelvic pain. You have chills. Your symptoms are not better in 72 hours with treatment. Summary Pelvic inflammatory disease (PID) is caused by an infection in some or all of the female reproductive organs. PID is a serious infection because it can lead to lasting (chronic) pelvic pain or the inability to have children (infertility). This infection is usually treated with antibiotic medicines. Do not have sex until treatment is completed or as told by your health care provider. This information is not intended to replace advice given to you by your health care provider. Make sure you discuss any questions you have with your healthcare provider. Document Revised: 05/25/2018 Document Reviewed: 05/30/2018 Elsevier Patient Education  2022 Elsevier Inc.   Gonorrhea Gonorrhea is an STI (sexually transmitted infection) that can infect both men and women. If left untreated, this infection can: Damage the female or female reproductive organs. Cause women and men to be unable to have children (infertility). Harm an unborn baby if an infected woman is pregnant. It is important to get treatment for gonorrhea as soon as possible. All of yoursex partners may also need to be treated for  the infection. What are the causes? This condition is caused by bacteria called Neisseria gonorrhoeae. The infection is spread from person to person through sexual contact, including oral, anal, and vaginal sex. A newborn can get the infection from hisor her mother during birth. What increases the risk? The following factors may make you more likely to develop this condition: Being a woman who is younger than age 61 and who is sexually active. Being a man who is gay or bisexual and who has sex with men. Having a new sex partner or having multiple partners. Having a sex partner  who has an STI. Not using condoms correctly or not using condoms every time you have sex. Having a history of STIs. Exchanging sex for money or drugs. What are the signs or symptoms? When symptoms occur, they may be different for women and men. Symptoms may include: Abnormal discharge from the penis or vagina. The discharge may be cloudy, thick, or yellow-green in color. Pain or burning when you urinate. Irritation, pain, bleeding, or discharge from the rectum. This may occur if the infection was spread by anal sex. Sore throat or swollen lymph nodes in the neck. This may occur if the infection was spread by oral sex. Pain or swelling in the testicles, in men. Bleeding between menstrual periods, in women. In some cases, there are no symptoms. How is this diagnosed? This condition is diagnosed based on: A physical exam. A sample of discharge that is looked at under a microscope to find any bacteria. The discharge may be taken from the penis, vagina, throat, or rectum. Urine tests. Not all test results will be available during your visit. How is this treated? This condition is treated with antibiotic medicines. It is important to start treatment as soon as possible. Early treatment may prevent some problems from developing. You should not have sex during treatment. All types of sexual activity should be avoided for at least 7 days after treatment is complete anduntil your sex partner or partners have been treated. Follow these instructions at home: Take over-the-counter and prescription medicines as told by your health care provider. Finish all antibiotic medicine even when you start to feel better. Do not have sex during treatment. Do not have sex until at least 7 days after you and your partner or partners have finished treatment and your health care provider says it is okay. It is up to you to get your test results. Ask your health care provider, or the department that is doing the test,  when your results will be ready. If you get a positive result on your gonorrhea test, tell your recent sex partners. These include any partners for oral, anal, or vaginal sex. They need to be checked for gonorrhea even if they do not have symptoms. They may need treatment, even if they get negative results on their gonorrhea tests. Drink enough fluid to keep your urine pale yellow. Keep all follow-up visits as told by your health care provider. This is important. How is this prevented?  Use latex condoms correctly every time you have sex. Ask if your sex partner or partners have been tested for STIs and had negative results. Avoid having multiple sex partners. Get regular health screenings to check for STIs. Contact a health care provider if: Your symptoms do not get better after a few days of taking antibiotics. Your symptoms get worse. You develop new symptoms, including: Eye irritation. Joint pain or swelling. Unusual rashes. You have a fever. Summary Gonorrhea  is an STI (sexually transmitted infection) that can infect both men and women. This infection is spread from person to person through sexual contact, including oral, anal, and vaginal sex. A newborn can get the infection from his or her mother during birth. Symptoms include abnormal discharge, pain or burning while urinating, or pain in the rectum. This condition is treated with antibiotic medicines. Do not have sex until at least 7 days after both you and any sex partners have completed antibiotic treatment. Tell your health care provider if your symptoms get worse or you have new symptoms. This information is not intended to replace advice given to you by your health care provider. Make sure you discuss any questions you have with your healthcare provider. Document Revised: 08/31/2019 Document Reviewed: 08/31/2019 Elsevier Patient Education  2022 [employer]

## 2021-06-29 ENCOUNTER — Ambulatory Visit: Payer: Medicaid Other | Admitting: Nurse Practitioner

## 2021-07-17 ENCOUNTER — Encounter: Payer: Medicaid Other | Admitting: Obstetrics & Gynecology

## 2022-01-12 ENCOUNTER — Telehealth: Payer: Medicaid Other | Admitting: Nurse Practitioner

## 2022-01-12 DIAGNOSIS — Z202 Contact with and (suspected) exposure to infections with a predominantly sexual mode of transmission: Secondary | ICD-10-CM

## 2022-01-12 MED ORDER — DOXYCYCLINE HYCLATE 100 MG PO TABS: 100.0000 mg | ORAL_TABLET | Freq: Two times a day (BID) | ORAL | 0 refills | Status: DC

## 2022-01-12 NOTE — Progress Notes (Signed)
? ?Virtual Visit Consent  ? ?Leah Rivera, you are scheduled for a virtual visit with Mary-Margaret Daphine Deutscher, FNP, a [facility] provider, today.   ?  ?Just as with appointments in the office, your consent must be obtained to participate.  Your consent will be active for this visit and any virtual visit you may have with one of our providers in the next 365 days.   ?  ?If you have a MyChart account, a copy of this consent can be sent to you electronically.  All virtual visits are billed to your insurance company just like a traditional visit in the office.   ? ?As this is a virtual visit, video technology does not allow for your provider to perform a traditional examination.  This may limit your provider's ability to fully assess your condition.  If your provider identifies any concerns that need to be evaluated in person or the need to arrange testing (such as labs, EKG, etc.), we will make arrangements to do so.   ?  ?Although advances in technology are sophisticated, we cannot ensure that it will always work on either your end or our end.  If the connection with a video visit is poor, the visit may have to be switched to a telephone visit.  With either a video or telephone visit, we are not always able to ensure that we have a secure connection.    ? ?I need to obtain your verbal consent now.   Are you willing to proceed with your visit today? YES ?  ?Leah Rivera has provided verbal consent on 01/12/2022 for a virtual visit (video or telephone). ?  ?Mary-Margaret Daphine Deutscher, FNP  ? ?Date: 01/12/2022 6:24 PM ? ? ?Virtual Visit via Video Note  ? ?I, Mary-Margaret Daphine Deutscher, connected with Leah Rivera ([id number], 1995/11/21) on 01/12/22 at  6:30 PM EDT by a video-enabled telemedicine application and verified that I am speaking with the correct person using two identifiers. ? ?Location: ?Patient: Virtual Visit Location Patient: Home ?Provider: Virtual Visit Location Provider: Mobile ?  ?I discussed the limitations of  evaluation and management by telemedicine and the availability of in person appointments. The patient expressed understanding and agreed to proceed.   ? ?History of Present Illness: ?Leah Rivera is a 26 y.o. who identifies as a female who was assigned female at birth, and is being seen today for chlamydia exposure. ? ?HPI: Patient state sthat her boyfriend went to the doctor several days ago and was dx with chlamydia. She denies any vaginal discharge, burning or itchung. No pain with intercourse. ?  ?Review of Systems  ?Constitutional:  Negative for chills and fever.  ?Gastrointestinal:  Negative for abdominal pain.  ?Genitourinary:  Negative for dysuria.  ? ?Problems:  ?Patient Active Problem List  ? Diagnosis Date Noted  ? BV (bacterial vaginosis) 08/02/2019  ? Chlamydia 08/02/2019  ? Vaginal discharge 02/02/2014  ? Encounter for counseling regarding contraception 02/02/2014  ?  ?Allergies: No Known Allergies ?Medications:  ?Current Outpatient Medications:  ?  doxycycline (VIBRA-TABS) 100 MG tablet, Take 1 tablet (100 mg total) by mouth 2 (two) times daily. 1 po bid, Disp: 14 tablet, Rfl: 0 ?  BORIC ACID EX, Apply 1 application topically., Disp: , Rfl:  ?  clindamycin (CLEOCIN) 300 MG capsule, Take 1 capsule (300 mg total) by mouth 2 (two) times daily. For seven days, Disp: 14 capsule, Rfl: 2 ?  fluconazole (DIFLUCAN) 150 MG tablet, Take 1 tablet (150 mg total) by mouth every 3 (three)  days. For three doses, Disp: 3 tablet, Rfl: 3 ?  HYDROcodone-acetaminophen (NORCO/VICODIN) 5-325 MG tablet, Take 1 tablet by mouth every 6 (six) hours as needed., Disp: 6 tablet, Rfl: 0 ?  metroNIDAZOLE (FLAGYL) 500 MG tablet, Take 1 tablet (500 mg total) by mouth 2 (two) times daily., Disp: 14 tablet, Rfl: 0 ? ?Observations/Objective: ?Patient is well-developed, well-nourished in no acute distress.  ?Resting comfortably  at home.  ?Head is normocephalic, atraumatic.  ?No labored breathing.  ?Speech is clear and coherent with  logical content.  ?Patient is alert and oriented at baseline.  ? ? ?Assessment and Plan: ? ?Leah Rivera in today with chief complaint of No chief complaint on file. ? ? ?1. Exposure to chlamydia ?Safe sex discussed ?Medication side effects reviewed ? ?Meds ordered this encounter  ?Medications  ? doxycycline (VIBRA-TABS) 100 MG tablet  ?  Sig: Take 1 tablet (100 mg total) by mouth 2 (two) times daily. 1 po bid  ?  Dispense:  14 tablet  ?  Refill:  0  ?  Order Specific Question:   Supervising Provider  ?  Answer:   Eber Hong [[id number]]  ? ? ? ? ? ? ?Follow Up Instructions: ?I discussed the assessment and treatment plan with the patient. The patient was provided an opportunity to ask questions and all were answered. The patient agreed with the plan and demonstrated an understanding of the instructions.  A copy of instructions were sent to the patient via MyChart. ? ?The patient was advised to call back or seek an in-person evaluation if the symptoms worsen or if the condition fails to improve as anticipated. ? ?Time:  ?I spent 7 minutes with the patient via telehealth technology discussing the above problems/concerns.   ? ?Mary-Margaret Daphine Deutscher, FNP ? ?

## 2022-01-12 NOTE — Patient Instructions (Signed)
Chlamydia, Female Chlamydia is a sexually transmitted infection (STI). This infection spreads through sexual contact. The infection can grow in: The urethra. This is the part of the body that drains pee (urine) from the bladder. The cervix. This is the lowest part of the womb (uterus). The throat. The opening of the butt (rectum). This condition is not hard to treat. But if it is not treated, it can cause worse health problems. You may have a higher risk of not being able to have children. Also, if you are pregnant or get pregnant and have untreated chlamydia: It can cause serious problems during pregnancy. It can spread to your baby during delivery and cause your baby to have health problems. What are the causes?  This condition is caused by a germ (bacteria) called Chlamydia trachomatis. These germs are spread from an infected partner during sex. The infection can spread through contact with the genitals, mouth, or the opening of the butt (rectum). What increases the risk? Not using a condom the right way. Not using a condom every time you have sex. Having a new sex partner. Having more than one sex partner. Being sexually active before age 25. What are the signs or symptoms? In some cases, there are no symptoms, especially early in the illness. If you get symptoms, they may include: Peeing often, or a burning feeling when you pee. Redness, soreness, or swelling of the vagina or butt. Fluid (discharge) coming from the vagina or butt. Pain the belly (abdomen). Pain during sex. Bleeding between monthly periods or irregular periods. How is this treated? This condition is treated with antibiotic medicines. Follow these instructions at home: Sexual activity Tell your sex partner or partners about your infection. Sex partners are people you had oral, anal, or vaginal sex with within 60 days of when you started getting sick. They need treatment even if they do not feel or seem sick. Do  not have sex until: You and your sex partners have been treated. Your doctor says it is okay. If you get just one dose of medicine, wait at least 7 days before having sex. General instructions Take over-the-counter and prescription medicines as told by your doctor. Finish your antibiotics even if you start to feel better. It is up to you to get your test results. Ask how to get your results when they are ready. Keep all follow-up visits. You may need tests after 3 months. How is this prevented? To lower your risk: Use latex or polyurethane condoms the right way. Do this every time you have sex. Do not have many sex partners. Ask if your sex partner got tested for STIs and had negative results. Get regular health screenings to check for STIs. Contact a doctor if: You get new symptoms. Your symptoms are getting worse or do not get better with treatment. You have a fever or chills. You have pain during sex. Your periods are irregular. You bleed between periods or after sex. You get flu-like symptoms. These may be: Night sweats. Sore throat. Muscle aches. You are unable to take your antibiotic medicine as prescribed. Summary Chlamydia is an infection that spreads through sexual contact. This condition is treated with antibiotics. If it is not treated, it can cause health problems. Your sex partners will also need to be treated. Do not have sex until both you and your partner have been treated. Take all medicines as told and keep all follow-up visits. This information is not intended to replace advice given to you   by your health care provider. Make sure you discuss any questions you have with your health care provider. Document Revised: 06/15/2021 Document Reviewed: 06/15/2021 Elsevier Patient Education  2023 Elsevier Inc.  

## 2022-06-11 ENCOUNTER — Encounter (HOSPITAL_COMMUNITY): Payer: Self-pay

## 2022-06-11 ENCOUNTER — Emergency Department (HOSPITAL_COMMUNITY)
Admission: EM | Admit: 2022-06-11 | Discharge: 2022-06-11 | Disposition: A | Discharge status: Home / Self Care | Payer: Medicaid Other | Attending: Emergency Medicine | Admitting: Emergency Medicine

## 2022-06-11 DIAGNOSIS — K0889 Other specified disorders of teeth and supporting structures: Secondary | ICD-10-CM | POA: Diagnosis present

## 2022-06-11 DIAGNOSIS — K047 Periapical abscess without sinus: Secondary | ICD-10-CM

## 2022-06-11 MED ORDER — AMOXICILLIN 500 MG PO CAPS: 500.0000 mg | ORAL_CAPSULE | Freq: Three times a day (TID) | ORAL | 0 refills | Status: DC

## 2022-06-11 MED ORDER — NAPROXEN 375 MG PO TABS: 375.0000 mg | ORAL_TABLET | Freq: Two times a day (BID) | ORAL | 0 refills | Status: DC

## 2022-06-11 NOTE — Discharge Instructions (Addendum)
You have been seen by your caregiver because of dental pain.  SEEK MEDICAL ATTENTION IF: The exam and treatment you received today has been provided on an emergency basis only. This is not a substitute for complete medical or dental care. If your problem worsens or new symptoms (problems) appear, and you are unable to arrange prompt follow-up care with your dentist, call or return to this location. CALL YOUR DENTIST OR RETURN IMMEDIATELY IF you develop a fever, rash, difficulty breathing or swallowing, neck or facial swelling, or other potentially serious concerns.    Free HIV and STD Testing These locations offer FREE confidential testing for HIV, Chlamydia, Gonorrhea, and Syphilis. Non-Traditional Testing Sites Address Telephone  [facility] [place] [phone] Mondays [place] [address], [place] [phone] Wednesdays 2pm-8pm  [facility] [address] [phone] Thursdays 9am-12noon 1pm-4pm  [facility] [address], [place] [phone] Tuesdays Thursdays 9am-12noon 1pm-4pm  [facility] offers free, confidential testing and treatment for HIV, Chlamydia, Gonorrhea, Syphilis, Herpes, Bacterial Vaginosis, Yeast, and Trichomoniasis. [facility] [address], [place]  Monday thru Friday  Call for an appointment  [facility] [address] [place] [phone] Monday thru Friday  Call for anappointment.  If you have any questions about this information please call [phone]. 07/29/2011

## 2022-06-11 NOTE — ED Provider Notes (Signed)
[facility] DEPT Provider Note   CSN: [id number] Arrival date & time: 06/11/22  1545     History  Chief Complaint  Patient presents with   Dental Pain    Leah Rivera is a 26 y.o. female who presents emergency department chief complaint of dental pain.  She has a decayed third lower right molar.  She started having some intermittent pain on that side about 2 days ago.  This morning she woke up with swelling on the right lower jaw.  She denies any difficulty swallowing, fevers, chills.  She denies any allergies to medications.  Pain is moderate.   Dental Pain      Home Medications Prior to Admission medications   Medication Sig Start Date End Date Taking? Authorizing Provider  amoxicillin (AMOXIL) 500 MG capsule Take 1 capsule (500 mg total) by mouth 3 (three) times daily. 06/11/22  Yes Jadore Mcguffin, PA-C  naproxen (NAPROSYN) 375 MG tablet Take 1 tablet (375 mg total) by mouth 2 (two) times daily with a meal. 06/11/22  Yes Taci Sterling, PA-C  BORIC ACID EX Apply 1 application topically.    [provider]  clindamycin (CLEOCIN) 300 MG capsule Take 1 capsule (300 mg total) by mouth 2 (two) times daily. For seven days 10/26/19   Anyanwu, Sallyanne Havers, MD  doxycycline (VIBRA-TABS) 100 MG tablet Take 1 tablet (100 mg total) by mouth 2 (two) times daily. 1 po bid 01/12/22   Hassell Done, Mary-Margaret, FNP  fluconazole (DIFLUCAN) 150 MG tablet Take 1 tablet (150 mg total) by mouth every 3 (three) days. For three doses 10/26/19   Anyanwu, Sallyanne Havers, MD  HYDROcodone-acetaminophen (NORCO/VICODIN) 5-325 MG tablet Take 1 tablet by mouth every 6 (six) hours as needed. 04/04/21   Volanda Napoleon, PA-C  metroNIDAZOLE (FLAGYL) 500 MG tablet Take 1 tablet (500 mg total) by mouth 2 (two) times daily. 04/04/21   Volanda Napoleon, PA-C      Allergies    Patient has no known allergies.    Review of Systems   Review of Systems  Physical Exam Updated Vital Signs BP  (!) 139/94 (BP Location: Left Arm)   Pulse 83   Temp 98.7 F (37.1 C)   Resp 16   Ht 5\' 9"  (1.753 m)   Wt 69.9 kg   LMP 05/21/2022   SpO2 100%   BMI 22.74 kg/m  Physical Exam Physical Exam  Nursing note and vitals reviewed. Constitutional: She is oriented to person, place, and time. She appears well-developed and well-nourished. No distress.  HENT:  Head: Normocephalic and atraumatic.  Mouth: Right third molar with decay, no fluctuance in the gum, uvula midline, no fullness below the mandibular angle. Eyes: Conjunctivae normal and EOM are normal. Pupils are equal, round, and reactive to light. No scleral icterus.  Neck: Normal range of motion.  Cardiovascular: Normal rate, regular rhythm and normal heart sounds.  Exam reveals no gallop and no friction rub.   No murmur heard. Pulmonary/Chest: Effort normal and breath sounds normal. No respiratory distress.  Abdominal: Soft. Bowel sounds are normal. She exhibits no distension and no mass. There is no tenderness. There is no guarding.  Neurological: She is alert and oriented to person, place, and time.  Skin: Skin is warm and dry. She is not diaphoretic.   ED Results / Procedures / Treatments   Labs (all labs ordered are listed, but only abnormal results are displayed) Labs Reviewed - No data to display  EKG None  Radiology No results found.  Procedures Procedures    Medications Ordered in ED Medications - No data to display  ED Course/ Medical Decision Making/ A&P                           Medical Decision Making  Patient with toothache.  No gross abscess.  Exam unconcerning for Ludwig's angina or spread of infection.  Will treat with penicillin and pain medicine.  Urged patient to follow-up with dentist.           Final Clinical Impression(s) / ED Diagnoses Final diagnoses:  Dental infection    Rx / DC Orders ED Discharge Orders          Ordered    naproxen (NAPROSYN) 375 MG tablet  2 times daily  with meals        06/11/22 1600    amoxicillin (AMOXIL) 500 MG capsule  3 times daily        06/11/22 1600              Arthor Captain, PA-C 06/11/22 1601    Melene Plan, DO 06/11/22 [phone]

## 2022-06-11 NOTE — ED Triage Notes (Signed)
Patient c/o right lower dental pain and swelling of the gum.  Patient denies any problems swallowing or breathing.

## 2022-08-20 ENCOUNTER — Telehealth: Payer: Commercial Managed Care - HMO | Admitting: Family Medicine

## 2022-08-20 ENCOUNTER — Emergency Department (HOSPITAL_COMMUNITY)
Admission: EM | Admit: 2022-08-20 | Discharge: 2022-08-21 | Disposition: A | Discharge status: Home / Self Care | Payer: Commercial Managed Care - HMO | Attending: Emergency Medicine | Admitting: Emergency Medicine

## 2022-08-20 ENCOUNTER — Other Ambulatory Visit: Payer: Self-pay

## 2022-08-20 ENCOUNTER — Encounter (HOSPITAL_COMMUNITY): Payer: Self-pay

## 2022-08-20 DIAGNOSIS — G510 Bell's palsy: Secondary | ICD-10-CM | POA: Diagnosis not present

## 2022-08-20 DIAGNOSIS — R29810 Facial weakness: Secondary | ICD-10-CM

## 2022-08-20 MED ORDER — PREDNISONE 20 MG PO TABS
60.0000 mg | ORAL_TABLET | Freq: Once | ORAL | Status: AC
  Administered 2022-08-20: 60 mg via ORAL
  Filled 2022-08-20: qty 3

## 2022-08-20 MED ORDER — PREDNISONE 20 MG PO TABS: 60.0000 mg | ORAL_TABLET | Freq: Every day | ORAL | 0 refills | Status: AC

## 2022-08-20 NOTE — Progress Notes (Signed)
[facility]   Needs to be seen ASAP in person- ED recommended given Bells Palsy/Stroke like symptoms.  Noted mild facial droop-lip right side, difficulty with puffing out cheeks, right eye puffiness.  Patient acknowledged agreement and understanding of the plan.

## 2022-08-20 NOTE — ED Triage Notes (Signed)
Patient reports that she feels like she can not close her right eye when she closes the left eye at the same time since yesterday. Patient c/o facial tingling that started today.

## 2022-08-20 NOTE — ED Provider Notes (Signed)
[facility]-EMERGENCY DEPT Provider Note   CSN: [id number] Arrival date & time: 08/20/22  1524     History  Chief Complaint  Patient presents with   Tingling    Leah Rivera is a 26 y.o. female presenting today with complaint of difficulty closing her right eye and a tingling sensation to the right side of her face that started yesterday.  She had a virtual visit with her doctor today who told her to come here.  Has not been ill recently.  No known past medical conditions.  HPI     Home Medications Prior to Admission medications   Medication Sig Start Date End Date Taking? Authorizing Provider  predniSONE (DELTASONE) 20 MG tablet Take 3 tablets (60 mg total) by mouth daily for 7 days. 08/20/22 08/27/22 Yes Maurica Omura A, PA-C  amoxicillin (AMOXIL) 500 MG capsule Take 1 capsule (500 mg total) by mouth 3 (three) times daily. 06/11/22   Harris, Abigail, PA-C  BORIC ACID EX Apply 1 application topically.    [provider]  clindamycin (CLEOCIN) 300 MG capsule Take 1 capsule (300 mg total) by mouth 2 (two) times daily. For seven days 10/26/19   Anyanwu, Jethro Bastos, MD  doxycycline (VIBRA-TABS) 100 MG tablet Take 1 tablet (100 mg total) by mouth 2 (two) times daily. 1 po bid 01/12/22   Daphine Deutscher, Mary-Margaret, FNP  fluconazole (DIFLUCAN) 150 MG tablet Take 1 tablet (150 mg total) by mouth every 3 (three) days. For three doses 10/26/19   Anyanwu, Jethro Bastos, MD  HYDROcodone-acetaminophen (NORCO/VICODIN) 5-325 MG tablet Take 1 tablet by mouth every 6 (six) hours as needed. 04/04/21   Maxwell Caul, PA-C  metroNIDAZOLE (FLAGYL) 500 MG tablet Take 1 tablet (500 mg total) by mouth 2 (two) times daily. 04/04/21   Maxwell Caul, PA-C  naproxen (NAPROSYN) 375 MG tablet Take 1 tablet (375 mg total) by mouth 2 (two) times daily with a meal. 06/11/22   Arthor Captain, PA-C      Allergies    Patient has no known allergies.    Review of Systems   Review of  Systems  Physical Exam Updated Vital Signs BP 135/85 (BP Location: Left Arm)   Pulse 85   Temp 98.9 F (37.2 C) (Oral)   Resp 16   Ht 5\' 9"  (1.753 m)   Wt 68.4 kg   LMP 07/04/2022 (Approximate)   SpO2 100%   BMI 22.28 kg/m  Physical Exam Vitals and nursing note reviewed.  Constitutional:      Appearance: Normal appearance.  HENT:     Head: Normocephalic and atraumatic.  Eyes:     General: No scleral icterus.    Conjunctiva/sclera: Conjunctivae normal.  Pulmonary:     Effort: Pulmonary effort is normal. No respiratory distress.  Skin:    Findings: No rash.  Neurological:     Mental Status: She is alert.     Comments: Cranial nerves II through XII grossly intact.  PERRLA.  Normal strength in bilateral upper and lower extremities.  No forehead sparing on the right side and she appears to have right-sided facial paralysis with drooping of the angle of her mouth on the right side.  Psychiatric:        Mood and Affect: Mood normal.     ED Results / Procedures / Treatments   Labs (all labs ordered are listed, but only abnormal results are displayed) Labs Reviewed - No data to display  EKG None  Radiology No results  found.  Procedures Procedures   Medications Ordered in ED Medications  predniSONE (DELTASONE) tablet 60 mg (has no administration in time range)    ED Course/ Medical Decision Making/ A&P                           Medical Decision Making Risk Prescription drug management.   26 year old female presenting today with difficulty closing her eye, facial droop and a tingling sensation to the right side of her face.  Differential included but is not limited to CVA, brain mass or cranial hemorrhage.  This is not exhaustive.  Physical exam: Patient has no forehead sparing and does have right-sided facial paralysis.  Also has drooping of the right face.  MDM/disposition: 26 year old female presenting with neurologic symptoms.  Normal strength in bilateral  upper extremities.  Her presentation is consistent with Bell's palsy.  I had my attending physician Dr. Deretha Emory come and see the patient and he is in agreement to this diagnoses.  She has been given prednisone and will be started on a 7-day outpatient burst.  Also given a referral to ophthalmology due to her right eye involvement.  She is currently able to close her right eye however we still would like her to have follow-up.  Is agreeable to discharge from triage and will follow-up with PCP and ophthalmology.   Final Clinical Impression(s) / ED Diagnoses Final diagnoses:  Bell's palsy    Rx / DC Orders ED Discharge Orders          Ordered    predniSONE (DELTASONE) 20 MG tablet  Daily        08/20/22 1619           Results and diagnoses were explained to the patient. Return precautions discussed in full. Patient had no additional questions and expressed complete understanding.   This chart was dictated using voice recognition software.  Despite best efforts to proofread,  errors can occur which can change the documentation meaning.    Saddie Benders, PA-C 08/20/22 1627    Vanetta Mulders, MD 08/25/22 [phone]

## 2022-08-20 NOTE — Patient Instructions (Signed)
Please report to the nearest ED for evaluation of your symptoms

## 2022-08-20 NOTE — Discharge Instructions (Addendum)
You came to the department today with difficulty closing your right eye and a weird sensation to the right side of your face.  Your condition is consistent with Bell's palsy.  This is best treated with steroids.  You have been given your first dose today and then you may take a daily dose for the next week.  I have also attached an eye doctor for you to follow-up with outpatient.  He will be able to reevaluate your eye in around a week and make sure there is not damage from you not being able to close it.  It was a pleasure to meet you and we hope you feel better.  Please return with any worsening.  Information about Bell's palsy is attached to these discharge papers.

## 2022-09-09 ENCOUNTER — Telehealth: Payer: Commercial Managed Care - HMO | Admitting: Family Medicine

## 2022-09-09 DIAGNOSIS — R0981 Nasal congestion: Secondary | ICD-10-CM | POA: Diagnosis not present

## 2022-09-09 DIAGNOSIS — H6690 Otitis media, unspecified, unspecified ear: Secondary | ICD-10-CM | POA: Diagnosis not present

## 2022-09-09 MED ORDER — FLUTICASONE PROPIONATE 50 MCG/ACT NA SUSP: 2.0000 | Freq: Every day | NASAL | 0 refills | Status: DC

## 2022-09-09 MED ORDER — NEOMYCIN-POLYMYXIN-HC 3.5-10000-1 OT SOLN: 3.0000 [drp] | Freq: Four times a day (QID) | OTIC | 0 refills | Status: AC

## 2022-09-09 NOTE — Progress Notes (Signed)
Virtual Visit Consent   Jensen Lefevers, you are scheduled for a virtual visit with a [facility] provider today. Just as with appointments in the office, your consent must be obtained to participate. Your consent will be active for this visit and any virtual visit you may have with one of our providers in the next 365 days. If you have a MyChart account, a copy of this consent can be sent to you electronically.  As this is a virtual visit, video technology does not allow for your provider to perform a traditional examination. This may limit your provider's ability to fully assess your condition. If your provider identifies any concerns that need to be evaluated in person or the need to arrange testing (such as labs, EKG, etc.), we will make arrangements to do so. Although advances in technology are sophisticated, we cannot ensure that it will always work on either your end or our end. If the connection with a video visit is poor, the visit may have to be switched to a telephone visit. With either a video or telephone visit, we are not always able to ensure that we have a secure connection.  By engaging in this virtual visit, you consent to the provision of healthcare and authorize for your insurance to be billed (if applicable) for the services provided during this visit. Depending on your insurance coverage, you may receive a charge related to this service.  I need to obtain your verbal consent now. Are you willing to proceed with your visit today? Ruther Suleiman has provided verbal consent on 09/09/2022 for a virtual visit (video or telephone). Freddy Finner, NP  Date: 09/09/2022 9:21 AM  Virtual Visit via Video Note   I, Freddy Finner, connected with  Leah Rivera  ([id number], 07-11-1996) on 09/09/22 at 10:00 AM EST by a video-enabled telemedicine application and verified that I am speaking with the correct person using two identifiers.  Location: Patient: Virtual Visit Location Patient:  Home Provider: Virtual Visit Location Provider: Home Office   I discussed the limitations of evaluation and management by telemedicine and the availability of in person appointments. The patient expressed understanding and agreed to proceed.    History of Present Illness: Leah Rivera is a 26 y.o. who identifies as a female who was assigned female at birth, and is being seen today for reports cold like symptoms in last 3 days. Sounds are far away. Associated congestion, pressure. Pain level ear is aching -tearful due to discomfort- unsure of pain level when asked. At night is worse.  Denies headache, chest pain, shortness of breath. Know known exposure to flu or covid.    Problems:  Patient Active Problem List   Diagnosis Date Noted   BV (bacterial vaginosis) 08/02/2019   Chlamydia 08/02/2019   Vaginal discharge 02/02/2014   Encounter for counseling regarding contraception 02/02/2014    Allergies: No Known Allergies Medications:  Current Outpatient Medications:    amoxicillin (AMOXIL) 500 MG capsule, Take 1 capsule (500 mg total) by mouth 3 (three) times daily., Disp: 21 capsule, Rfl: 0   BORIC ACID EX, Apply 1 application topically., Disp: , Rfl:    clindamycin (CLEOCIN) 300 MG capsule, Take 1 capsule (300 mg total) by mouth 2 (two) times daily. For seven days, Disp: 14 capsule, Rfl: 2   doxycycline (VIBRA-TABS) 100 MG tablet, Take 1 tablet (100 mg total) by mouth 2 (two) times daily. 1 po bid, Disp: 14 tablet, Rfl: 0   fluconazole (DIFLUCAN) 150 MG tablet,  Take 1 tablet (150 mg total) by mouth every 3 (three) days. For three doses, Disp: 3 tablet, Rfl: 3   HYDROcodone-acetaminophen (NORCO/VICODIN) 5-325 MG tablet, Take 1 tablet by mouth every 6 (six) hours as needed., Disp: 6 tablet, Rfl: 0   metroNIDAZOLE (FLAGYL) 500 MG tablet, Take 1 tablet (500 mg total) by mouth 2 (two) times daily., Disp: 14 tablet, Rfl: 0   naproxen (NAPROSYN) 375 MG tablet, Take 1 tablet (375 mg total) by  mouth 2 (two) times daily with a meal., Disp: 20 tablet, Rfl: 0  Observations/Objective: Patient is well-developed, tearful and holding left ear Resting comfortably  at home.  Head is normocephalic, atraumatic.  No labored breathing.  Speech is clear and coherent with logical content.  Patient is alert and oriented at baseline.    Assessment and Plan:  1. Ear infection  - neomycin-polymyxin-hydrocortisone (CORTISPORIN) OTIC solution; Place 3 drops into the left ear 4 (four) times daily for 7 days.  Dispense: 10 mL; Refill: 0  2. Nasal congestion  - fluticasone (FLONASE) 50 MCG/ACT nasal spray; Place 2 sprays into both nostrils daily.  Dispense: 16 g; Refill: 0   HOME CARE: Wash your hands frequently. If you are prescribed an ear drop, do not place the tip of the bottle on your ear or touch it with your fingers. You can take Acetaminophen 650 mg every 4-6 hours as needed for pain.  If pain is severe or moderate, you can apply a heating pad (set on low) or hot water bottle (wrapped in a towel) to outer ear for 20 minutes.  This will also increase drainage.   Follow Up Instructions: I discussed the assessment and treatment plan with the patient. The patient was provided an opportunity to ask questions and all were answered. The patient agreed with the plan and demonstrated an understanding of the instructions.  A copy of instructions were sent to the patient via MyChart unless otherwise noted below.     The patient was advised to call back or seek an in-person evaluation if the symptoms worsen or if the condition fails to improve as anticipated.  Time:  I spent 10 minutes with the patient via telehealth technology discussing the above problems/concerns.    Freddy Finner, NP

## 2022-09-09 NOTE — Patient Instructions (Addendum)
[name], thank you for joining Freddy Finner, NP for today's virtual visit.  While this provider is not your primary care provider (PCP), if your PCP is located in our provider database this encounter information will be shared with them immediately following your visit.   A [facility] MyChart account gives you access to today's visit and all your visits, tests, and labs performed at [facility] " click here if you don't have a [facility] MyChart account or go to [email or web address]  Consent: (Patient) Leah Rivera provided verbal consent for this virtual visit at the beginning of the encounter.  Current Medications:  Current Outpatient Medications:    fluticasone (FLONASE) 50 MCG/ACT nasal spray, Place 2 sprays into both nostrils daily., Disp: 16 g, Rfl: 0   neomycin-polymyxin-hydrocortisone (CORTISPORIN) OTIC solution, Place 3 drops into the left ear 4 (four) times daily for 7 days., Disp: 10 mL, Rfl: 0   amoxicillin (AMOXIL) 500 MG capsule, Take 1 capsule (500 mg total) by mouth 3 (three) times daily., Disp: 21 capsule, Rfl: 0   BORIC ACID EX, Apply 1 application topically., Disp: , Rfl:    clindamycin (CLEOCIN) 300 MG capsule, Take 1 capsule (300 mg total) by mouth 2 (two) times daily. For seven days, Disp: 14 capsule, Rfl: 2   doxycycline (VIBRA-TABS) 100 MG tablet, Take 1 tablet (100 mg total) by mouth 2 (two) times daily. 1 po bid, Disp: 14 tablet, Rfl: 0   fluconazole (DIFLUCAN) 150 MG tablet, Take 1 tablet (150 mg total) by mouth every 3 (three) days. For three doses, Disp: 3 tablet, Rfl: 3   HYDROcodone-acetaminophen (NORCO/VICODIN) 5-325 MG tablet, Take 1 tablet by mouth every 6 (six) hours as needed., Disp: 6 tablet, Rfl: 0   metroNIDAZOLE (FLAGYL) 500 MG tablet, Take 1 tablet (500 mg total) by mouth 2 (two) times daily., Disp: 14 tablet, Rfl: 0   naproxen (NAPROSYN) 375 MG tablet, Take 1 tablet (375 mg total) by mouth 2 (two) times daily with a meal.,  Disp: 20 tablet, Rfl: 0   Medications ordered in this encounter:  Meds ordered this encounter  Medications   neomycin-polymyxin-hydrocortisone (CORTISPORIN) OTIC solution    Sig: Place 3 drops into the left ear 4 (four) times daily for 7 days.    Dispense:  10 mL    Refill:  0    Order Specific Question:   Supervising Provider    Answer:   Merrilee Jansky [[id number]]   fluticasone (FLONASE) 50 MCG/ACT nasal spray    Sig: Place 2 sprays into both nostrils daily.    Dispense:  16 g    Refill:  0    Order Specific Question:   Supervising Provider    Answer:   Merrilee Jansky [id number]     *If you need refills on other medications prior to your next appointment, please contact your pharmacy*  Follow-Up: Call back or seek an in-person evaluation if the symptoms worsen or if the condition fails to improve as anticipated.  [facility] [phone]  Other Instructions   HOME CARE: Wash your hands frequently. If you are prescribed an ear drop, do not place the tip of the bottle on your ear or touch it with your fingers. You can take Acetaminophen 650 mg every 4-6 hours as needed for pain.  If pain is severe or moderate, you can apply a heating pad (set on low) or hot water bottle (wrapped in a towel) to outer ear for 20 minutes.  This will also increase drainage.   If you have been instructed to have an in-person evaluation today at a local Urgent Care facility, please use the link below. It will take you to a list of all of our available [facility], including address, phone number and hours of operation. Please do not delay care.  [facility]  If you or a family member do not have a primary care provider, use the link below to schedule a visit and establish care. When you choose a [facility] primary care physician or advanced practice provider, you gain a long-term partner in health. Find a Primary Care Provider  Learn more about [facility]'s in-office and virtual care options:  - Get Care Now

## 2022-09-10 ENCOUNTER — Ambulatory Visit
Admission: RE | Admit: 2022-09-10 | Discharge: 2022-09-10 | Disposition: A | Discharge status: Home / Self Care | Payer: Commercial Managed Care - HMO | Source: Ambulatory Visit | Attending: Urgent Care | Admitting: Urgent Care

## 2022-09-10 VITALS — BP 130/90 | HR 88 | Temp 98.2°F | Resp 18

## 2022-09-10 DIAGNOSIS — H65193 Other acute nonsuppurative otitis media, bilateral: Secondary | ICD-10-CM | POA: Diagnosis not present

## 2022-09-10 MED ORDER — AMOXICILLIN-POT CLAVULANATE 875-125 MG PO TABS: 1.0000 | ORAL_TABLET | Freq: Two times a day (BID) | ORAL | 0 refills | Status: DC

## 2022-09-10 MED ORDER — PSEUDOEPHEDRINE HCL 60 MG PO TABS: 60.0000 mg | ORAL_TABLET | Freq: Three times a day (TID) | ORAL | 0 refills | Status: DC | PRN

## 2022-09-10 MED ORDER — CETIRIZINE HCL 10 MG PO TABS: 10.0000 mg | ORAL_TABLET | Freq: Every day | ORAL | 0 refills | Status: DC

## 2022-09-10 NOTE — ED Provider Notes (Signed)
[facility] URGENT [facility]ARE CENTER  Note:  This document was prepared using Conservation officer, historic buildings and may include unintentional dictation errors.  MRN: [id number] DOB: [birth year]  Subjective:   Leah Rivera is a 26 y.o. female presenting for 4-day history of acute onset persistent bilateral ear pain worse on the right.  Feels like her ears are draining and fluid-filled.  Reports that the pain is very uncomfortable and severe.  She had a video visit and was prescribed eardrops and a nasal spray which has not helped.  No history of ear infections.  No current facility-administered medications for this encounter.  Current Outpatient Medications:    amoxicillin (AMOXIL) 500 MG capsule, Take 1 capsule (500 mg total) by mouth 3 (three) times daily., Disp: 21 capsule, Rfl: 0   BORIC ACID EX, Apply 1 application topically., Disp: , Rfl:    clindamycin (CLEOCIN) 300 MG capsule, Take 1 capsule (300 mg total) by mouth 2 (two) times daily. For seven days, Disp: 14 capsule, Rfl: 2   doxycycline (VIBRA-TABS) 100 MG tablet, Take 1 tablet (100 mg total) by mouth 2 (two) times daily. 1 po bid, Disp: 14 tablet, Rfl: 0   fluconazole (DIFLUCAN) 150 MG tablet, Take 1 tablet (150 mg total) by mouth every 3 (three) days. For three doses, Disp: 3 tablet, Rfl: 3   fluticasone (FLONASE) 50 MCG/ACT nasal spray, Place 2 sprays into both nostrils daily., Disp: 16 g, Rfl: 0   HYDROcodone-acetaminophen (NORCO/VICODIN) 5-325 MG tablet, Take 1 tablet by mouth every 6 (six) hours as needed., Disp: 6 tablet, Rfl: 0   metroNIDAZOLE (FLAGYL) 500 MG tablet, Take 1 tablet (500 mg total) by mouth 2 (two) times daily., Disp: 14 tablet, Rfl: 0   naproxen (NAPROSYN) 375 MG tablet, Take 1 tablet (375 mg total) by mouth 2 (two) times daily with a meal., Disp: 20 tablet, Rfl: 0   neomycin-polymyxin-hydrocortisone (CORTISPORIN) OTIC solution, Place 3 drops into the left ear 4 (four) times daily for 7 days., Disp: 10 mL, Rfl:  0   No Known Allergies  Past Medical History:  Diagnosis Date   Medical history non-contributory      Past Surgical History:  Procedure Laterality Date   NO PAST SURGERIES     TOOTH EXTRACTION      Family History  Problem Relation Age of Onset   Healthy Mother    Healthy Father     Social History   Tobacco Use   Smoking status: Former    Packs/day: 2.00    Types: Cigarettes   Smokeless tobacco: Never  Vaping Use   Vaping Use: Never used  Substance Use Topics   Alcohol use: Yes    Comment: occ   Drug use: Yes    Types: Marijuana    Comment: every day    ROS   Objective:   Vitals: BP (!) 130/90 (BP Location: Right Arm)   Pulse 88   Temp 98.2 F (36.8 C)   Resp 18   LMP 08/20/2022 (Approximate)   SpO2 99%   Physical Exam Constitutional:      General: She is not in acute distress.    Appearance: Normal appearance. She is well-developed and normal weight. She is not ill-appearing, toxic-appearing or diaphoretic.  HENT:     Head: Normocephalic and atraumatic.     Right Ear: Ear canal and external ear normal. No drainage or tenderness. A middle ear effusion is present. There is no impacted cerumen. Tympanic membrane is erythematous and bulging.  Tympanic membrane is not injected or perforated.     Left Ear: Ear canal and external ear normal. No drainage or tenderness.  No middle ear effusion. There is no impacted cerumen. Tympanic membrane is erythematous and bulging. Tympanic membrane is not injected or perforated.     Nose: Nose normal. No congestion or rhinorrhea.     Mouth/Throat:     Mouth: Mucous membranes are moist. No oral lesions.     Pharynx: No pharyngeal swelling, oropharyngeal exudate, posterior oropharyngeal erythema or uvula swelling.     Tonsils: No tonsillar exudate or tonsillar abscesses.  Eyes:     General: No scleral icterus.       Right eye: No discharge.        Left eye: No discharge.     Extraocular Movements: Extraocular movements  intact.     Right eye: Normal extraocular motion.     Left eye: Normal extraocular motion.     Conjunctiva/sclera: Conjunctivae normal.  Cardiovascular:     Rate and Rhythm: Normal rate.  Pulmonary:     Effort: Pulmonary effort is normal.  Musculoskeletal:     Cervical back: Normal range of motion and neck supple.  Lymphadenopathy:     Cervical: No cervical adenopathy.  Skin:    General: Skin is warm and dry.  Neurological:     General: No focal deficit present.     Mental Status: She is alert and oriented to person, place, and time.  Psychiatric:        Mood and Affect: Mood normal.        Behavior: Behavior normal.     Assessment and Plan :   PDMP not reviewed this encounter.  1. Other non-recurrent acute nonsuppurative otitis media of both ears     Start Augmentin to cover for bilateral otitis media given physical exam findings. Use supportive care otherwise. Counseled patient on potential for adverse effects with medications prescribed/recommended today, ER and return-to-clinic precautions discussed, patient verbalized understanding.    Wallis Bamberg, [place] 09/11/22 [phone]

## 2022-09-10 NOTE — ED Triage Notes (Addendum)
Pt c/o pain and pressure to both ears, nasal congestion-states she had a virtual visit yesterday-no improvement with ear drops and nasal spray (neomycin and flonase)-steady gait

## 2022-10-13 ENCOUNTER — Encounter (HOSPITAL_BASED_OUTPATIENT_CLINIC_OR_DEPARTMENT_OTHER): Payer: Self-pay

## 2022-10-15 ENCOUNTER — Encounter: Payer: Medicaid Other | Admitting: Obstetrics & Gynecology

## 2022-11-08 ENCOUNTER — Ambulatory Visit (HOSPITAL_BASED_OUTPATIENT_CLINIC_OR_DEPARTMENT_OTHER): Payer: Commercial Managed Care - HMO | Admitting: Family Medicine

## 2023-03-27 IMAGING — CT CT ABD-PELV W/ CM
2 of 4 series · 17 of 46 positions shown, 19 images · IV contrast (omnipaque)
Comparison: None.

CLINICAL DATA: Abdominal pain.  Diarrhea.

EXAM:
CT ABDOMEN AND PELVIS WITH CONTRAST
TECHNIQUE: Multidetector CT imaging of the abdomen and pelvis was performed
using the standard protocol following bolus administration of
intravenous contrast.
CONTRAST:  80mL OMNIPAQUE IOHEXOL 350 MG/ML SOLN

[Series 2: axial st · axial · 0.68mm/px · z∈[-512,-148]mm · 14 of 83 slices shown, 16 images]
[im 5/83  soft-tissue]
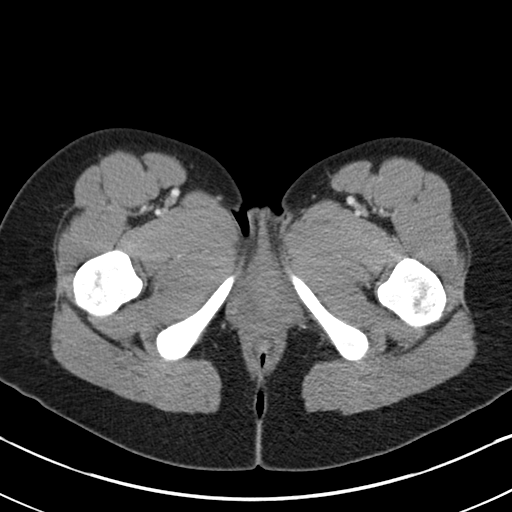
[im 5/83  bone]
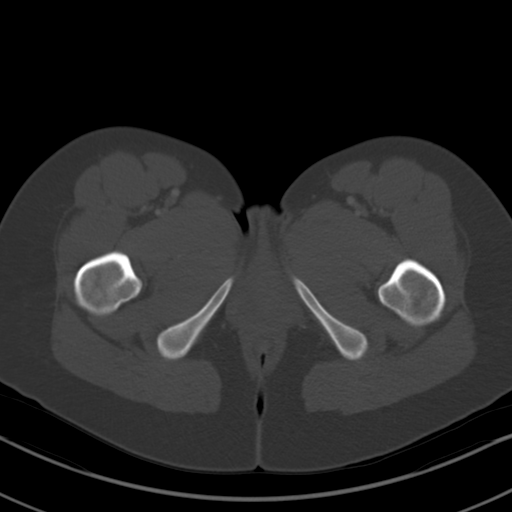
[im 10/83  soft-tissue]
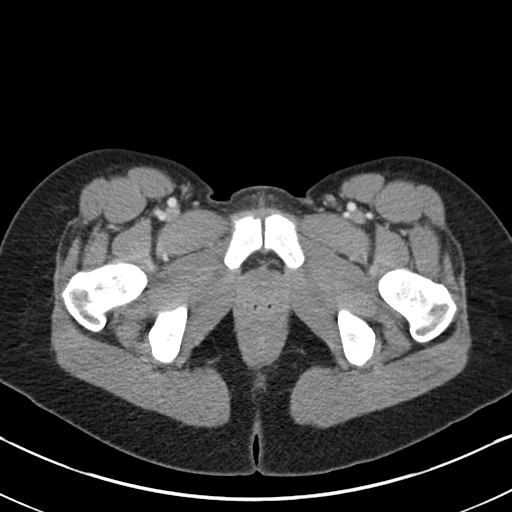
[im 15/83  soft-tissue]
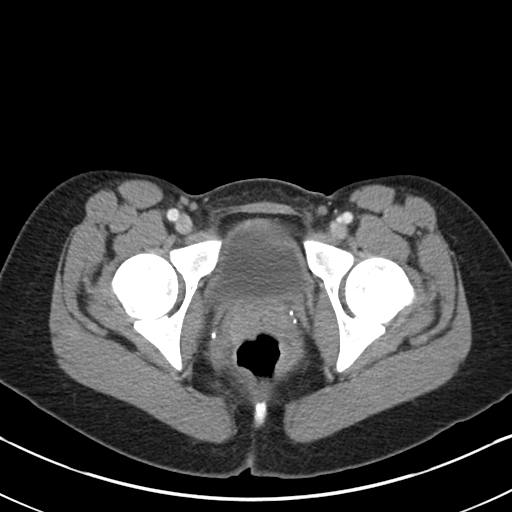
[im 25/83  soft-tissue]
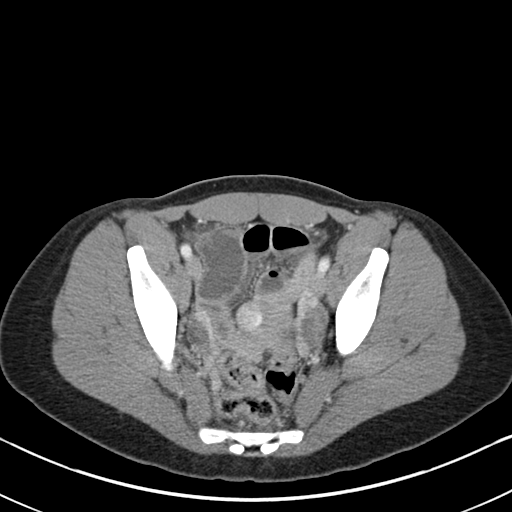
[im 29/83  soft-tissue]
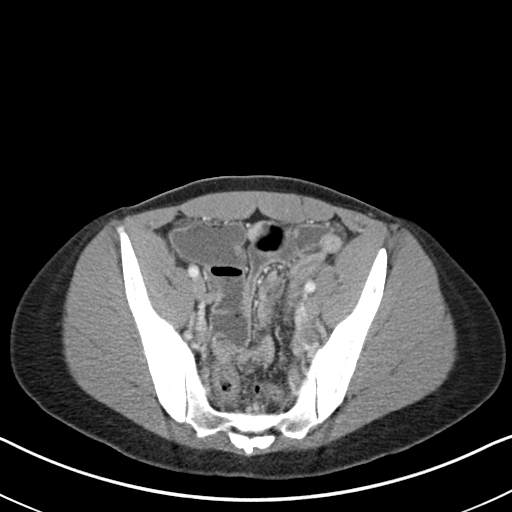
[im 34/83  soft-tissue]
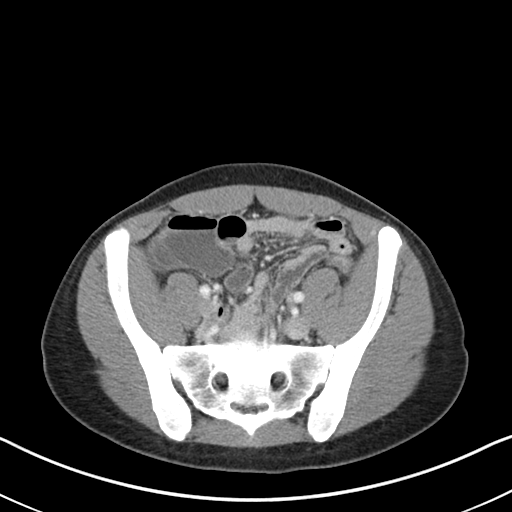
[im 39/83  soft-tissue]
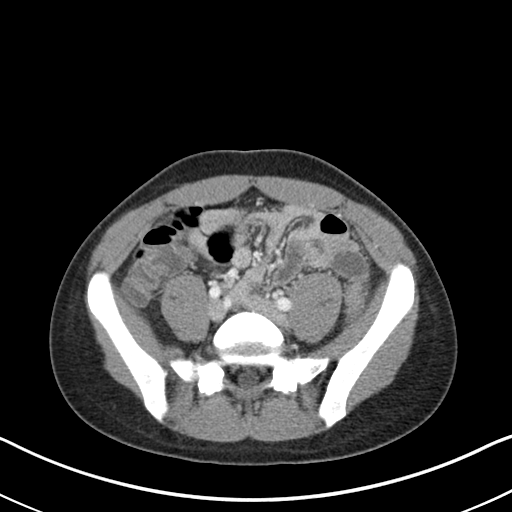
[im 44/83  soft-tissue]
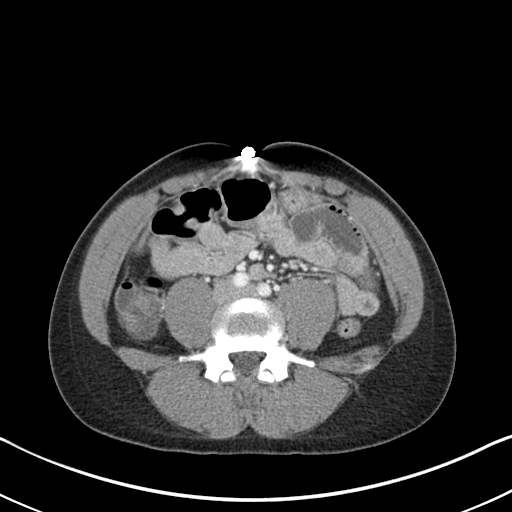
[im 49/83  soft-tissue]
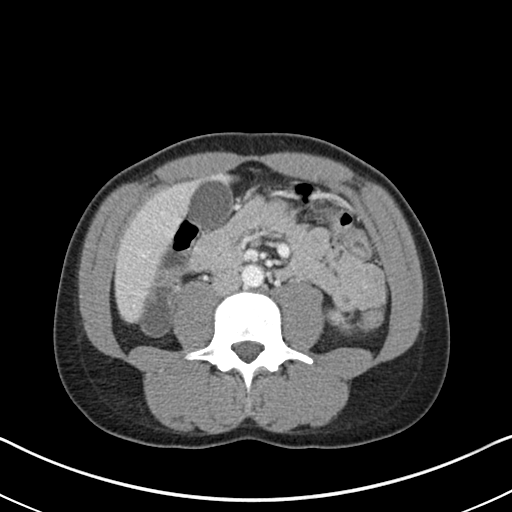
[im 49/83  bone]
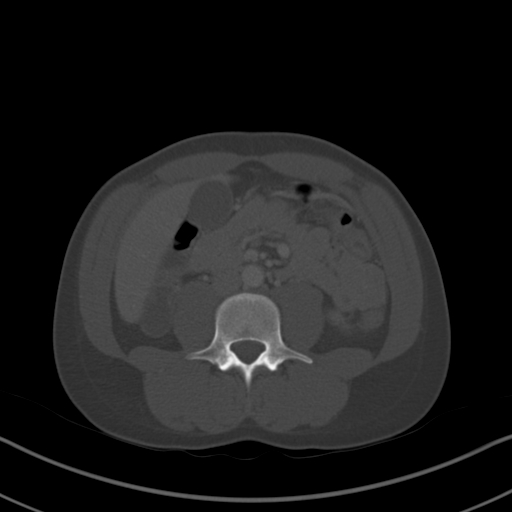
[im 54/83  soft-tissue]
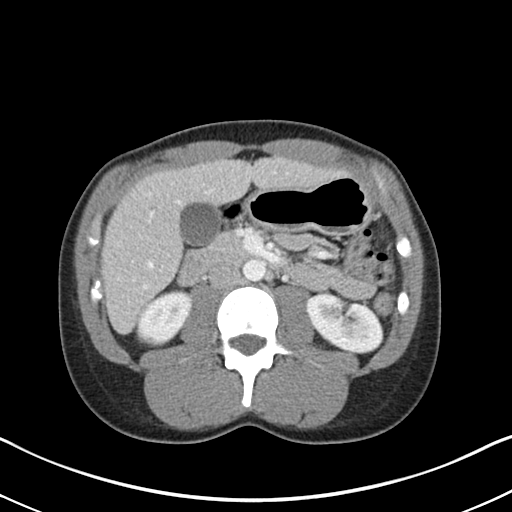
[im 63/83  soft-tissue]
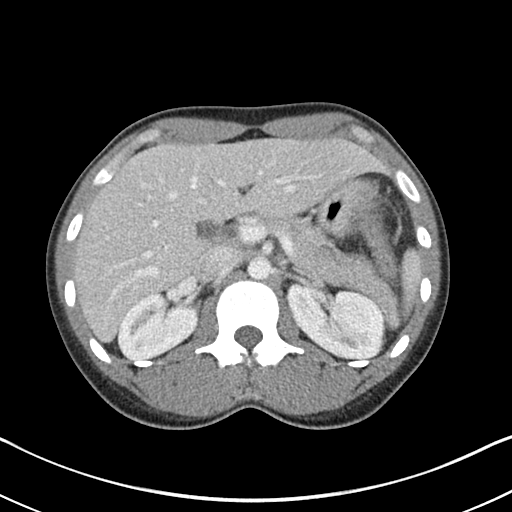
[im 68/83  soft-tissue]
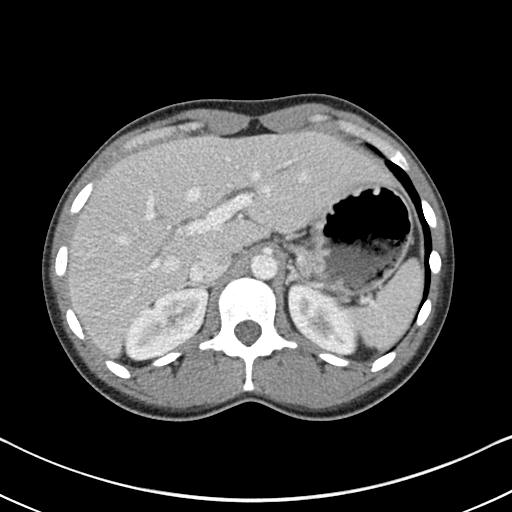
[im 73/83  soft-tissue]
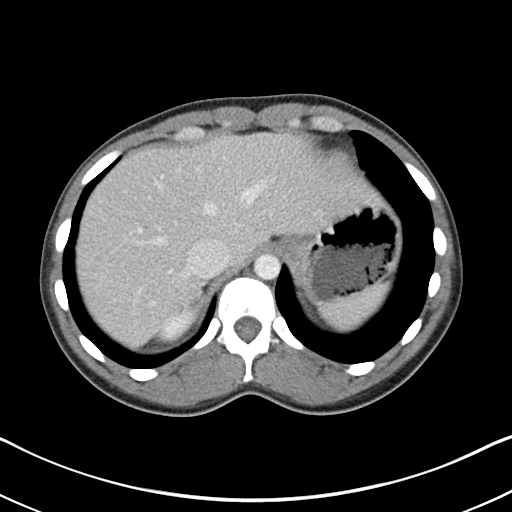
[im 78/83  soft-tissue]
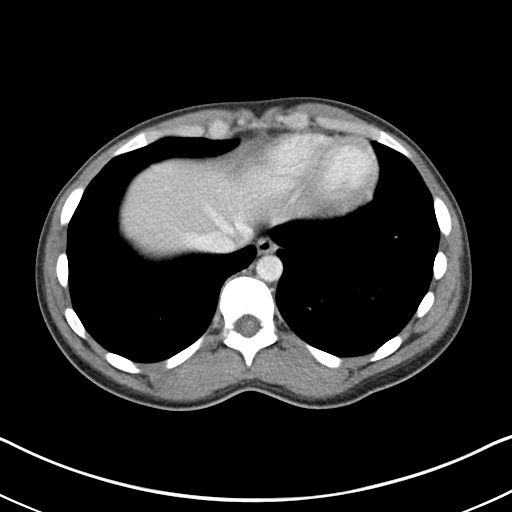

[Series 5: coronal st · coronal · 0.79mm/px · 3 of 134 slices shown]
[im 45/134  soft-tissue]
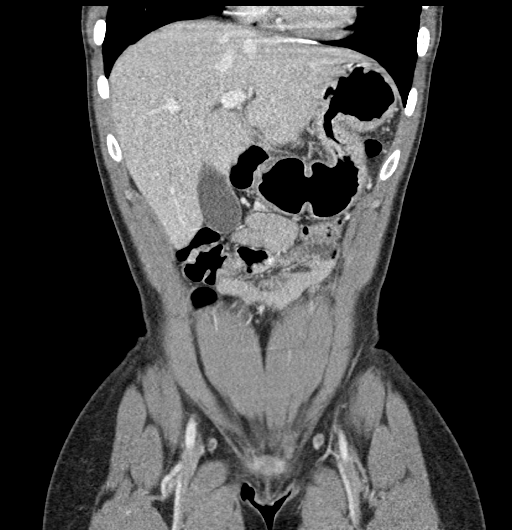
[im 60/134  soft-tissue]
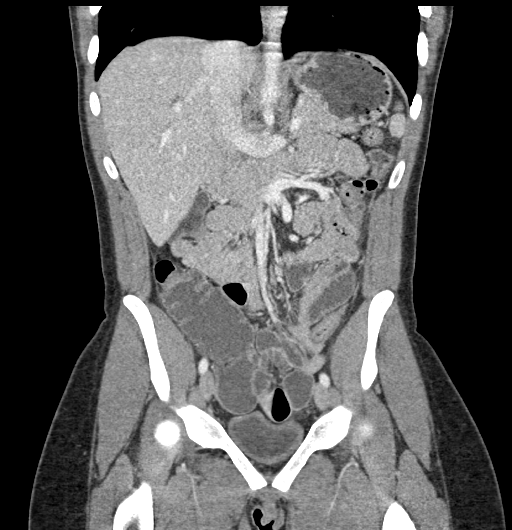
[im 74/134  soft-tissue]
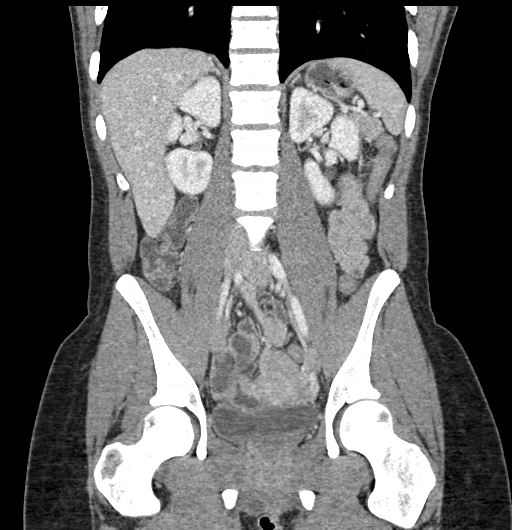

[17 of 46 positions shown; findings below may reference images not displayed]

FINDINGS: Lower chest: No acute abnormality.

Hepatobiliary: No focal liver abnormality is seen. No gallstones,
gallbladder wall thickening, or biliary dilatation.

Pancreas: Unremarkable. No pancreatic ductal dilatation or
surrounding inflammatory changes.

Spleen: Normal in size without focal abnormality.

Adrenals/Urinary Tract: Adrenal glands are unremarkable. Kidneys are
normal, without renal calculi, focal lesion, or hydronephrosis.
Bladder is unremarkable.

Stomach/Bowel: Stomach is within normal limits. Appendix appears
normal. No evidence of bowel wall thickening, distention, or
inflammatory changes.

Vascular/Lymphatic: No significant vascular findings are present. No
enlarged abdominal or pelvic lymph nodes.

Reproductive: Normal appearance of the ovaries. 1.8 cm subserosal
myometrial mass in the right hand side of the uterine body.

Other: No abdominal wall hernia or abnormality. No abdominopelvic
ascites.

Musculoskeletal: No acute or significant osseous findings.
IMPRESSION: 1. No evidence of acute abnormalities within the abdomen or pelvis.
[DATE] cm subserosal myometrial mass within the uterine body. This
may represent a fibroid. Further evaluation with nonemergent pelvic
ultrasound may be considered, given patient's young age.

## 2023-03-29 IMAGING — US US PELVIS COMPLETE WITH TRANSVAGINAL
2 series · 15 of 25 positions shown · non-contrast
Comparison: 04/04/2021

CLINICAL DATA: Uterine mass on previous CT

EXAM:
TRANSABDOMINAL AND TRANSVAGINAL ULTRASOUND OF PELVIS
TECHNIQUE: Both transabdominal and transvaginal ultrasound examinations of the
pelvis were performed. Transabdominal technique was performed for
global imaging of the pelvis including uterus, ovaries, adnexal
regions, and pelvic cul-de-sac. It was necessary to proceed with
endovaginal exam following the transabdominal exam to visualize the
endometrium and adnexal structures.

[Series 1: us pelvis complete mc & wl · 14 of 56 slices shown (1 of 2)]
[im 1/56]
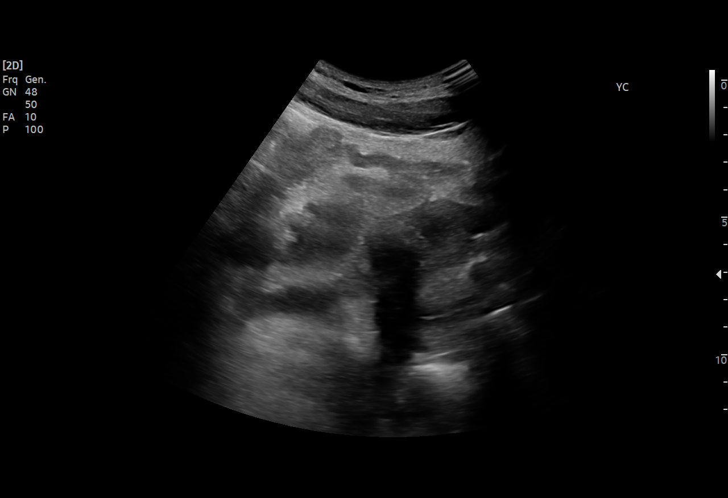
[im 5/56]
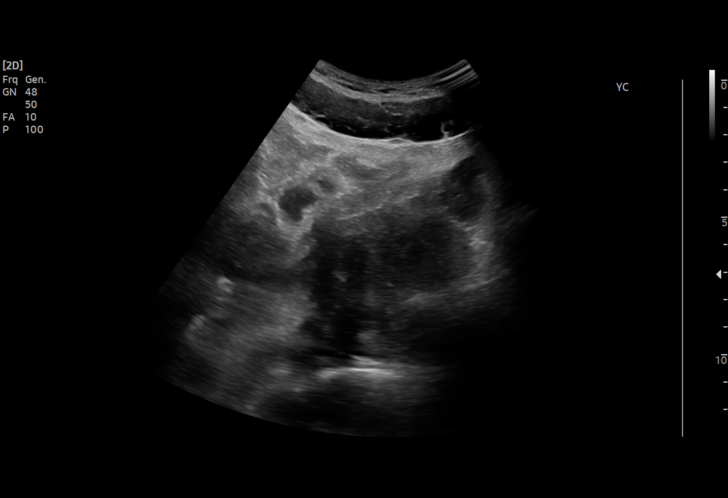
[im 10/56]
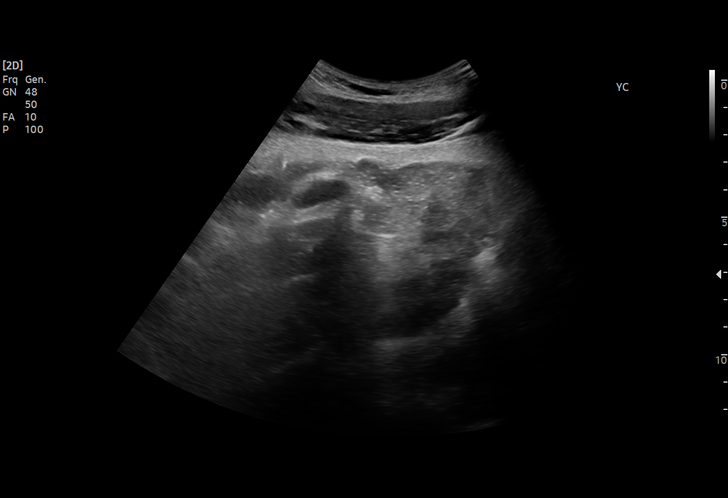
[im 12/56]
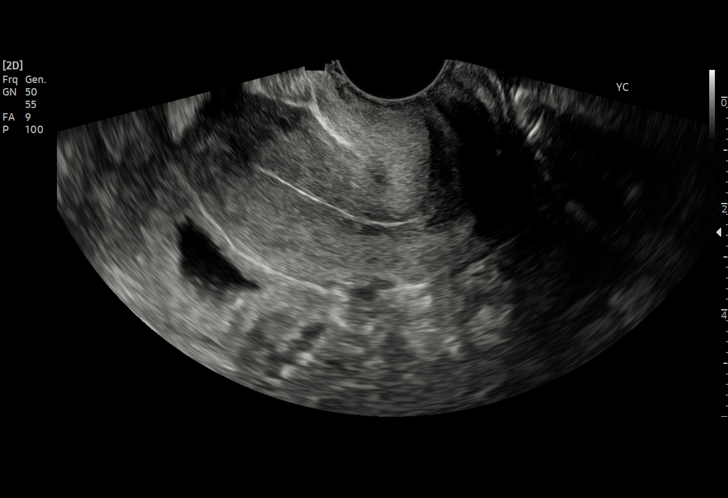
[im 17/56]
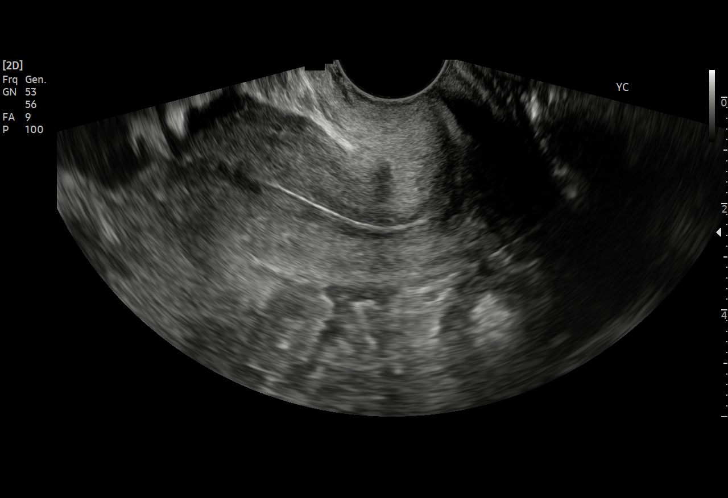
[im 22/56]
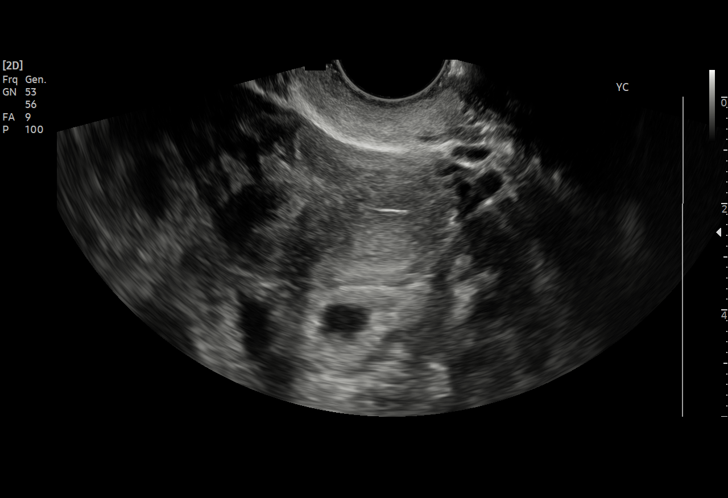
[im 24/56]
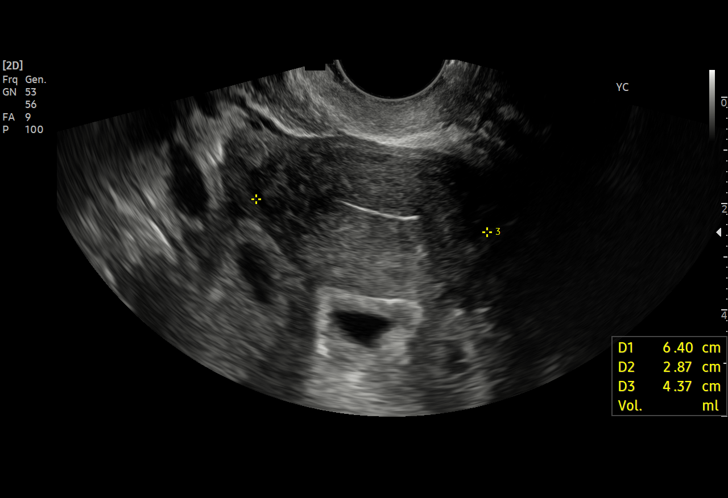
[im 29/56]
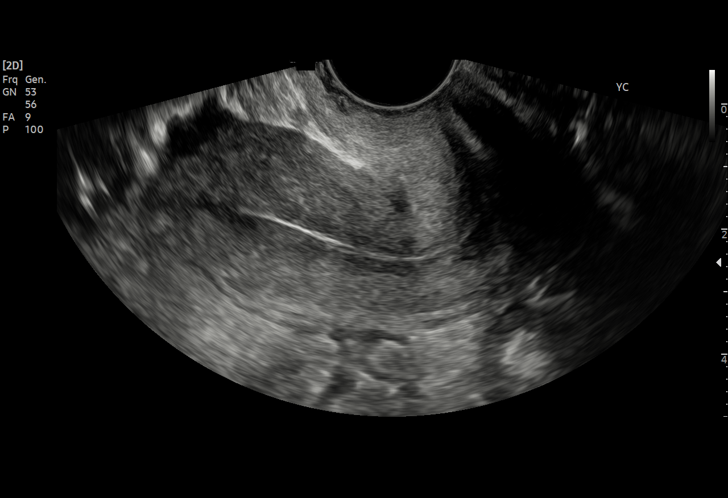
[im 34/56]
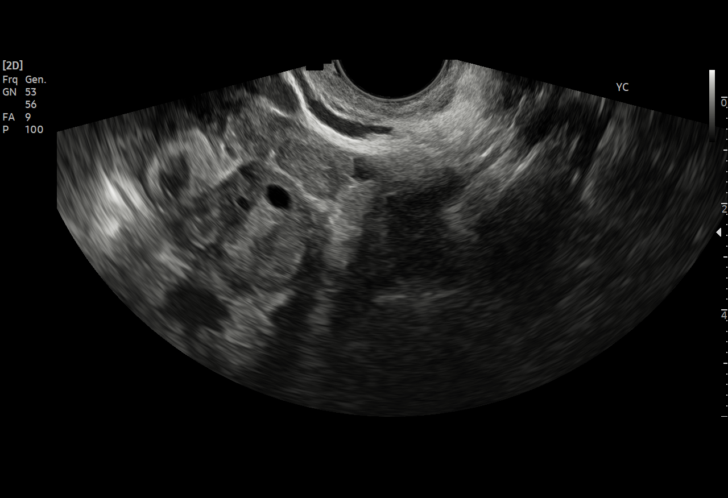
[im 36/56]
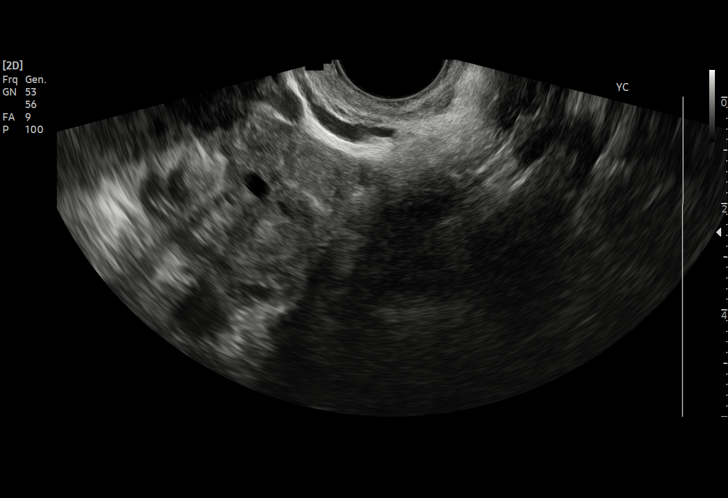
[im 41/56]
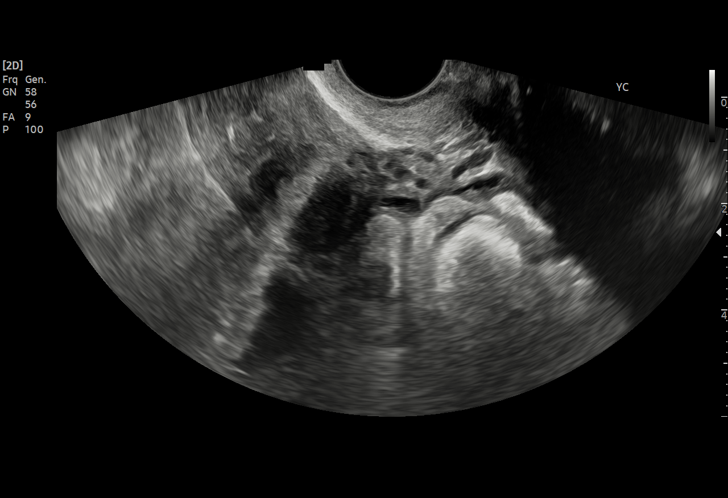
[im 46/56]
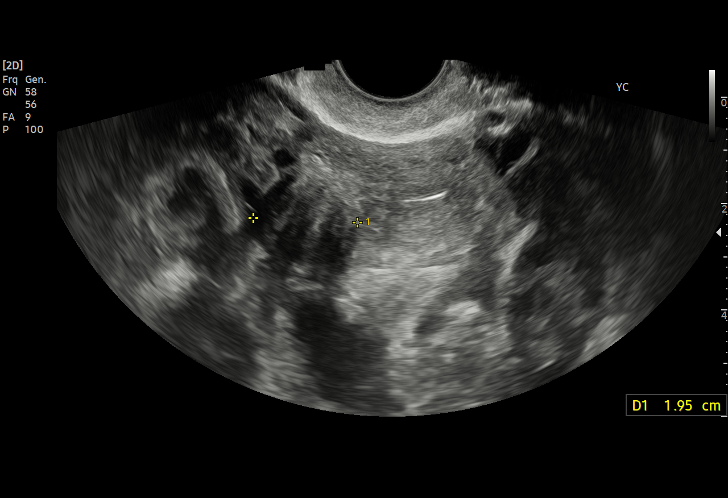
[im 48/56]
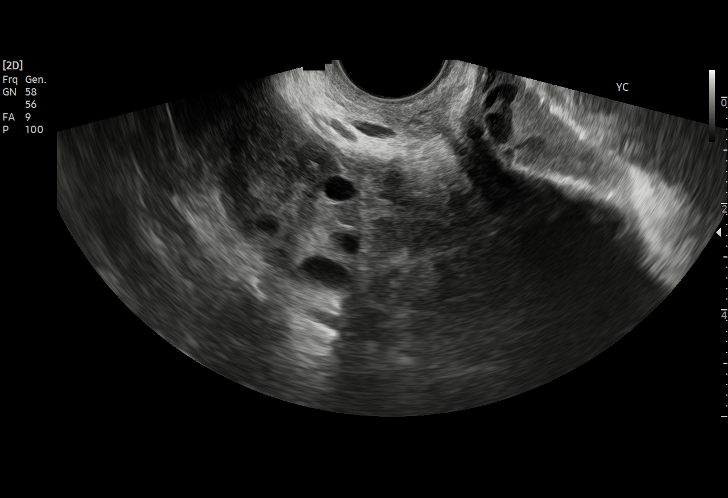
[im 53/56]
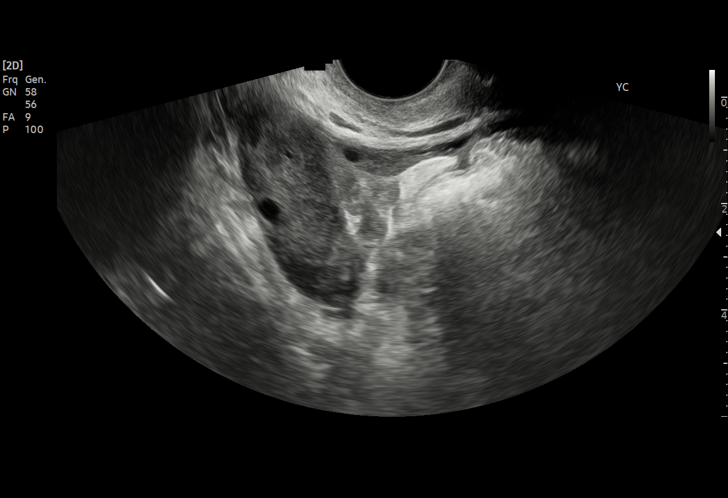

[Series 2: us pelvis complete mc & wl · 1 of 2 slices shown (2 of 2)]
[im 1/2]
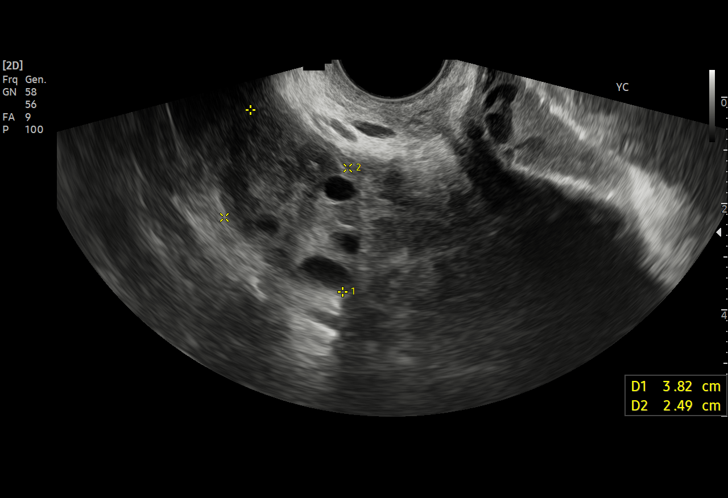

[15 of 25 positions shown; findings below may reference images not displayed]

FINDINGS: Uterus

Measurements: 6.4 x 2.9 x 4.4 cm = volume: 42.0 mL. There is a 2.3 x
1.8 x 2.0 cm hypoechoic mass exophytic off the right posterolateral
aspect of the uterine fundus, consistent with fibroid. This
corresponds to previous CT finding. No additional uterine
abnormalities.

Endometrium

Thickness: 1 mm.  No focal abnormality visualized.

Right ovary

Measurements: 3.8 x 2.5 x 1.7 cm = volume: 8.3 mL. Normal
appearance/no adnexal mass.

Left ovary

Measurements: 3.1 x 1.6 x 1.8 cm = volume: 4.6 mL. Normal
appearance/no adnexal mass.

Other findings

Trace pelvic free fluid is likely physiologic.
IMPRESSION: 1. 2.3 cm uterine fibroid off the right posterolateral aspect of the
uterine fundus. This corresponds to CT finding.
2. Otherwise unremarkable age-appropriate pelvic ultrasound.

## 2023-04-14 ENCOUNTER — Ambulatory Visit: Payer: Medicaid Other

## 2023-04-14 ENCOUNTER — Ambulatory Visit (HOSPITAL_COMMUNITY): Payer: Medicaid Other

## 2023-04-14 ENCOUNTER — Telehealth: Payer: Self-pay

## 2023-04-14 NOTE — Telephone Encounter (Signed)
Patient is scheduled for an appointment today @3 :15pm. Patient entered she wants a PAP Smear. I've tried calling patient to inform her we don't provide that service at our location but her voicemail box  is not set up so I will attempt to send a mychart message to alert her of this.

## 2023-08-21 ENCOUNTER — Ambulatory Visit: Payer: Medicaid Other

## 2023-08-30 ENCOUNTER — Encounter: Payer: Self-pay | Admitting: Internal Medicine

## 2023-08-30 ENCOUNTER — Ambulatory Visit (INDEPENDENT_AMBULATORY_CARE_PROVIDER_SITE_OTHER): Payer: 59 | Admitting: Internal Medicine

## 2023-08-30 ENCOUNTER — Other Ambulatory Visit (HOSPITAL_COMMUNITY)
Admission: RE | Admit: 2023-08-30 | Discharge: 2023-08-30 | Disposition: A | Discharge status: Home / Self Care | Payer: 59 | Source: Ambulatory Visit | Attending: Internal Medicine | Admitting: Internal Medicine

## 2023-08-30 VITALS — BP 128/84 | HR 102 | Temp 98.1°F | Ht 69.0 in | Wt 154.8 lb

## 2023-08-30 DIAGNOSIS — H00014 Hordeolum externum left upper eyelid: Secondary | ICD-10-CM | POA: Diagnosis not present

## 2023-08-30 DIAGNOSIS — Z113 Encounter for screening for infections with a predominantly sexual mode of transmission: Secondary | ICD-10-CM | POA: Diagnosis not present

## 2023-08-30 DIAGNOSIS — B9689 Other specified bacterial agents as the cause of diseases classified elsewhere: Secondary | ICD-10-CM | POA: Diagnosis not present

## 2023-08-30 DIAGNOSIS — Z114 Encounter for screening for human immunodeficiency virus [HIV]: Secondary | ICD-10-CM

## 2023-08-30 DIAGNOSIS — N760 Acute vaginitis: Secondary | ICD-10-CM | POA: Insufficient documentation

## 2023-08-30 DIAGNOSIS — Z1159 Encounter for screening for other viral diseases: Secondary | ICD-10-CM | POA: Diagnosis not present

## 2023-08-30 LAB — POCT URINE PREGNANCY: Preg Test, Ur: NEGATIVE

## 2023-08-30 MED ORDER — ERYTHROMYCIN 5 MG/GM OP OINT: TOPICAL_OINTMENT | OPHTHALMIC | 0 refills | Status: DC

## 2023-08-30 NOTE — Progress Notes (Signed)
[facility] [address] [place] [address] Dept: [phone] Dept Fax: [phone]  New Patient Office Visit  Subjective:   Leah Rivera 06-22-1996 08/30/2023  Chief Complaint  Patient presents with   Establish Care   screening for STD   Stye    HPI: Leah Rivera presents today to establish care at [facility]. Introduced to Publishing rights manager role and practice setting.  All questions answered.  Concerns: See below   Discussed the use of AI scribe software for clinical note transcription with the patient, who gave verbal consent to proceed.  History of Present Illness   The patient,  presents with a stye in her left eye  onset 2 weeks ago. No visual changes. Has not tried any OTC treatments. She also requests STD testing. She is sexually active with 1 female partner. Reports using protection during sexual intercourse. She has noticed some white vaginal discharge with occasional odor but denies any itching or bleeding. Her last menstrual period was a month ago. She has a past medical history of chlamydia, which was treated, and recurrent bacterial vaginosis.      The following portions of the patient's history were reviewed and updated as appropriate: past medical history, past surgical history, family history, social history, allergies, medications, and problem list.   There are no problems to display for this patient.  Past Medical History:  Diagnosis Date   Chlamydia 08/02/2019   +08/02/2019  TOC in 4-6 weeks     Past Surgical History:  Procedure Laterality Date   TOOTH EXTRACTION     Family History  Problem Relation Age of Onset   Healthy Mother    Healthy Father     Current Outpatient Medications:    erythromycin ophthalmic ointment, Instill ~1cm ribbon into affected eye(s) 4 times a day for 7 days, Disp: 3.5 g, Rfl: 0 No Known Allergies  ROS: A complete ROS was performed  with pertinent positives/negatives noted in the HPI. The remainder of the ROS are negative.   Objective:   Today's Vitals   08/30/23 0829  BP: 128/84  Pulse: (!) 102  Temp: 98.1 F (36.7 C)  TempSrc: Temporal  SpO2: 99%  Weight: 154 lb 12.8 oz (70.2 kg)  Height: 5\' 9"  (1.753 m)    GENERAL: Well-appearing, in NAD. Well nourished.  SKIN: Pink, warm and dry.  HEENT:    HEAD: Normocephalic, non-traumatic.  EYES: Conjunctive pink without exudate. PERRL, EOMI. Sty to outer aspect of left upper eyelid NECK: Trachea midline.  RESPIRATORY: Chest wall symmetrical. Respirations even and non-labored. NEUROLOGIC: No motor or sensory deficits. Steady, even gait.  PSYCH/MENTAL STATUS: Alert, oriented x 3. Cooperative, appropriate mood and affect.   Health Maintenance Due  Topic Date Due   Hepatitis C Screening  Never done   DTaP/Tdap/Td (1 - Tdap) Never done   Cervical Cancer Screening (Pap smear)  08/03/2021    Results for orders placed or performed in visit on 08/30/23  POCT urine pregnancy  Result Value Ref Range   Preg Test, Ur Negative Negative    Assessment & Plan:  Assessment and Plan    Hordeolum (Sty) -Prescribe erythromycin ointment to be applied four times daily. -Advise warm compresses four to five times daily.  Sexually Transmitted Disease (STD) Screening Sexually active with one female partner. Protection used. History of Chlamydia and recurrent Bacterial Vaginosis (BV). Current mild vaginal discharge with occasional odor. No itching or bleeding. -Perform self-swab for Gonorrhea, Chlamydia,  Trichomonas, and BV. -Collect urine sample for pregnancy test. -Order blood tests for Hepatitis C, HIV, and Syphilis.  General Health Maintenance -Schedule annual physical in four months with fasting blood work panel, Pap smear, and tetanus shot update if needed.       Orders Placed This Encounter  Procedures   RPR   Hepatitis C antibody   HIV antibody (with reflex)   POCT  urine pregnancy   Meds ordered this encounter  Medications   erythromycin ophthalmic ointment    Sig: Instill ~1cm ribbon into affected eye(s) 4 times a day for 7 days    Dispense:  3.5 g    Refill:  0    Order Specific Question:   Supervising Provider    Answer:   Garnette Gunner [[id number]]    Return in about 4 months (around 12/29/2023) for Fasting Annual Physical Exam.   Salvatore Decent, FNP

## 2023-08-31 ENCOUNTER — Encounter: Payer: Self-pay | Admitting: Internal Medicine

## 2023-08-31 LAB — CERVICOVAGINAL ANCILLARY ONLY
Bacterial Vaginitis (gardnerella): POSITIVE — AB
Candida Glabrata: NEGATIVE
Candida Vaginitis: NEGATIVE
Chlamydia: NEGATIVE
Comment: NEGATIVE
Comment: NEGATIVE
Comment: NEGATIVE
Comment: NEGATIVE
Comment: NEGATIVE
Comment: NORMAL
Neisseria Gonorrhea: NEGATIVE
Trichomonas: NEGATIVE

## 2023-08-31 LAB — HEPATITIS C ANTIBODY: Hepatitis C Ab: NONREACTIVE

## 2023-08-31 LAB — RPR: RPR Ser Ql: NONREACTIVE

## 2023-08-31 LAB — HIV ANTIBODY (ROUTINE TESTING W REFLEX): HIV 1&2 Ab, 4th Generation: NONREACTIVE

## 2023-09-01 ENCOUNTER — Other Ambulatory Visit: Payer: Self-pay | Admitting: Internal Medicine

## 2023-09-01 DIAGNOSIS — N760 Acute vaginitis: Secondary | ICD-10-CM

## 2023-09-01 MED ORDER — METRONIDAZOLE 500 MG PO TABS: 500.0000 mg | ORAL_TABLET | Freq: Two times a day (BID) | ORAL | 0 refills | Status: AC

## 2023-09-06 ENCOUNTER — Ambulatory Visit: Payer: Medicaid Other | Admitting: Internal Medicine

## 2023-09-27 ENCOUNTER — Telehealth: Payer: 59 | Admitting: Physician Assistant

## 2023-09-27 DIAGNOSIS — K047 Periapical abscess without sinus: Secondary | ICD-10-CM | POA: Diagnosis not present

## 2023-09-27 MED ORDER — NAPROXEN 500 MG PO TABS: 500.0000 mg | ORAL_TABLET | Freq: Two times a day (BID) | ORAL | 0 refills | Status: DC

## 2023-09-27 MED ORDER — AMOXICILLIN-POT CLAVULANATE 875-125 MG PO TABS: 1.0000 | ORAL_TABLET | Freq: Two times a day (BID) | ORAL | 0 refills | Status: DC

## 2023-09-27 NOTE — Progress Notes (Signed)
 I have spent 5 minutes in review of e-visit questionnaire, review and updating patient chart, medical decision making and response to patient.   Piedad Climes, PA-C

## 2023-09-27 NOTE — Progress Notes (Signed)

## 2023-10-01 ENCOUNTER — Telehealth: Payer: 59 | Admitting: Family Medicine

## 2023-10-01 DIAGNOSIS — N760 Acute vaginitis: Secondary | ICD-10-CM

## 2023-10-01 MED ORDER — FLUCONAZOLE 150 MG PO TABS: 150.0000 mg | ORAL_TABLET | Freq: Once | ORAL | 0 refills | Status: AC

## 2023-10-01 NOTE — Progress Notes (Signed)

## 2024-01-07 ENCOUNTER — Telehealth: Admitting: Nurse Practitioner

## 2024-01-07 DIAGNOSIS — K089 Disorder of teeth and supporting structures, unspecified: Secondary | ICD-10-CM | POA: Diagnosis not present

## 2024-01-07 MED ORDER — IBUPROFEN 600 MG PO TABS: 600.0000 mg | ORAL_TABLET | Freq: Three times a day (TID) | ORAL | 0 refills | Status: DC | PRN

## 2024-01-07 MED ORDER — CHLORHEXIDINE GLUCONATE 0.12 % MT SOLN: 15.0000 mL | Freq: Two times a day (BID) | OROMUCOSAL | 0 refills | Status: DC

## 2024-01-07 NOTE — Progress Notes (Signed)
 E-Visit for Dental Pain  We are sorry that you are not feeling well.  Here is how we plan to help!  Based on what you have shared with me in the questionnaire, it sounds like you have a dental problem that should be evaluated by a dentist.   I have prescribed prescription strength ibuprofen  and an antibacterial prescription mouth rinse to treat any bacteria in the gums and teeth.   It is imperative that you see a dentist within 10 days of this eVisit to determine the cause of the dental pain and be sure it is adequately treated  A toothache or tooth pain is caused when the nerve in the root of a tooth or surrounding a tooth is irritated. Dental (tooth) infection, decay, injury, or loss of a tooth are the most common causes of dental pain. Pain may also occur after an extraction (tooth is pulled out). Pain sometimes originates from other areas and radiates to the jaw, thus appearing to be tooth pain.Bacteria growing inside your mouth can contribute to gum disease and dental decay, both of which can cause pain. A toothache occurs from inflammation of the central portion of the tooth called pulp. The pulp contains nerve endings that are very sensitive to pain. Inflammation to the pulp or pulpitis may be caused by dental cavities, trauma, and infection.    HOME CARE:   For toothaches: Over-the-counter pain medications such as acetaminophen  or ibuprofen  may be used. Take these as directed on the package while you arrange for a dental appointment. Avoid very cold or hot foods, because they may make the pain worse. You may get relief from biting on a cotton ball soaked in oil of cloves. You can get oil of cloves at most drug stores.  For jaw pain:  Aspirin may be helpful for problems in the joint of the jaw in adults. If pain happens every time you open your mouth widely, the temporomandibular joint (TMJ) may be the source of the pain. Yawning or taking a large bite of food may worsen the pain. An  appointment with your doctor or dentist will help you find the cause.     GET HELP RIGHT AWAY IF:  You have a high fever or chills If you have had a recent head or face injury and develop headache, light headedness, nausea, vomiting, or other symptoms that concern you after an injury to your face or mouth, you could have a more serious injury in addition to your dental injury. A facial rash associated with a toothache: This condition may improve with medication. Contact your doctor for them to decide what is appropriate. Any jaw pain occurring with chest pain: Although jaw pain is most commonly caused by dental disease, it is sometimes referred pain from other areas. People with heart disease, especially people who have had stents placed, people with diabetes, or those who have had heart surgery may have jaw pain as a symptom of heart attack or angina. If your jaw or tooth pain is associated with lightheadedness, sweating, or shortness of breath, you should see a doctor as soon as possible. Trouble swallowing or excessive pain or bleeding from gums: If you have a history of a weakened immune system, diabetes, or steroid use, you may be more susceptible to infections. Infections can often be more severe and extensive or caused by unusual organisms. Dental and gum infections in people with these conditions may require more aggressive treatment. An abscess may need draining or IV antibiotics, for  example.  MAKE SURE YOU   Understand these instructions. Will watch your condition. Will get help right away if you are not doing well or get worse.  Thank you for choosing an e-visit.  Your e-visit answers were reviewed by a board certified advanced clinical practitioner to complete your personal care plan. Depending upon the condition, your plan could have included both over the counter or prescription medications.  Please review your pharmacy choice. Make sure the pharmacy is open so you can pick up  prescription now. If there is a problem, you may contact your provider through [employer] and have the prescription routed to another pharmacy.  Your safety is important to us . If you have drug allergies check your prescription carefully.   For the next 24 hours you can use MyChart to ask questions about today's visit, request a non-urgent call back, or ask for a work or school excuse. You will get an email in the next two days asking about your experience. I hope that your e-visit has been valuable and will speed your recovery.

## 2024-01-07 NOTE — Progress Notes (Signed)
 I have spent 5 minutes in review of e-visit questionnaire, review and updating patient chart, medical decision making and response to patient.   Claiborne Rigg, NP

## 2024-04-11 ENCOUNTER — Telehealth

## 2024-04-11 DIAGNOSIS — K047 Periapical abscess without sinus: Secondary | ICD-10-CM | POA: Diagnosis not present

## 2024-04-12 MED ORDER — NAPROXEN 500 MG PO TABS: 500.0000 mg | ORAL_TABLET | Freq: Two times a day (BID) | ORAL | 0 refills | Status: AC

## 2024-04-12 MED ORDER — AMOXICILLIN-POT CLAVULANATE 875-125 MG PO TABS: 1.0000 | ORAL_TABLET | Freq: Two times a day (BID) | ORAL | 0 refills | Status: AC

## 2024-04-12 NOTE — Progress Notes (Signed)
 Message sent to patient requesting further input regarding current symptoms. Awaiting patient response.

## 2024-04-12 NOTE — Progress Notes (Signed)
 I have spent 5 minutes in review of e-visit questionnaire, review and updating patient chart, medical decision making and response to patient.   Piedad Climes, PA-C

## 2024-04-12 NOTE — Progress Notes (Signed)

## 2024-04-16 ENCOUNTER — Ambulatory Visit: Admitting: Internal Medicine

## 2024-04-16 NOTE — Progress Notes (Deleted)
  [facility] [address] [place] [address] Dept: [phone] Dept Fax: [phone]  Acute Care Office Visit  Subjective:   Leah Rivera [birth year] 04/16/2024  No chief complaint on file.   HPI:    The following portions of the patient's history were reviewed and updated as appropriate: past medical history, past surgical history, family history, social history, allergies, medications, and problem list.   There are no active problems to display for this patient.  Past Medical History:  Diagnosis Date   Chlamydia 08/02/2019   +08/02/2019  TOC in 4-6 weeks     Past Surgical History:  Procedure Laterality Date   TOOTH EXTRACTION     Family History  Problem Relation Age of Onset   Healthy Mother    Healthy Father     Current Outpatient Medications:    amoxicillin -clavulanate (AUGMENTIN ) 875-125 MG tablet, Take 1 tablet by mouth 2 (two) times daily., Disp: 14 tablet, Rfl: 0   naproxen  (NAPROSYN ) 500 MG tablet, Take 1 tablet (500 mg total) by mouth 2 (two) times daily with a meal., Disp: 20 tablet, Rfl: 0 No Known Allergies   ROS: A complete ROS was performed with pertinent positives/negatives noted in the HPI. The remainder of the ROS are negative.    Objective:   There were no vitals filed for this visit.  GENERAL: Well-appearing, in NAD. Well nourished.  SKIN: Pink, warm and dry.  RESPIRATORY: Chest wall symmetrical. Respirations even and non-labored.  CARDIAC: Peripheral pulses 2+ bilaterally.  GU: External genitalia without erythema, lesions, or masses. No lymphadenopathy. Vaginal mucosa pink and moist without exudate, lesions, or ulcerations. Cervix pink without discharge. Cervical os closed. Uterus and adnexae palpable, not enlarged, and w/o tenderness. No palpable masses.  EXTREMITIES: Without clubbing, cyanosis, or edema.  PSYCH/MENTAL STATUS: Alert, oriented x 3. Cooperative, appropriate mood and  affect.   Chaperoned by Angelyn Hollingsworth CMA    No results found for any visits on 04/16/24.    Assessment & Plan:   No orders of the defined types were placed in this encounter.  No orders of the defined types were placed in this encounter.  Lab Orders  No laboratory test(s) ordered today   No images are attached to the encounter or orders placed in the encounter.  No follow-ups on file.   Rosina Senters, FNP

## 2024-04-23 ENCOUNTER — Encounter: Payer: Self-pay | Admitting: Internal Medicine

## 2024-05-01 ENCOUNTER — Telehealth: Admitting: Family Medicine

## 2024-05-01 DIAGNOSIS — K0889 Other specified disorders of teeth and supporting structures: Secondary | ICD-10-CM

## 2024-05-01 NOTE — Progress Notes (Signed)
  Because of recent treatment via e-visit for dental pain/infection with recurring symptoms, I feel your condition warrants further evaluation and I recommend that you be seen in a face-to-face visit.   NOTE: There will be NO CHARGE for this E-Visit   If you are having a true medical emergency, please call 911.     For an urgent face to face visit, [place] has multiple urgent care centers for your convenience.  Click the link below for the full list of locations and hours, walk-in wait times, appointment scheduling options and driving directions:  Urgent Care - [place], [place], [place], [place], [place], [place]  [place]     Your MyChart E-visit questionnaire answers were reviewed by a board certified advanced clinical practitioner to complete your personal care plan based on your specific symptoms.    Thank you for using e-Visits.

## 2024-05-14 ENCOUNTER — Encounter: Admitting: Internal Medicine

## 2024-05-14 NOTE — Progress Notes (Deleted)
 Subjective:   Leah Rivera May 02, 1996  05/14/2024   CC: No chief complaint on file.   HPI: Leah Rivera is a 28 y.o. female who presents for a routine health maintenance exam.  Labs *** collected at time of visit.    HEALTH SCREENINGS: - Pap smear: {Blank single:19197::pap done,not applicable,up to date,done elsewhere} - Mammogram (40+): Not applicable  - Colonoscopy (45+): Not applicable  - Bone Density (65+): Not applicable  - Lung CA screening with low-dose CT:  Not applicable Adults age 79-80 who are current cigarette smokers or quit within the last 15 years. Must have 20 pack year history.   IMMUNIZATIONS: - Tdap: Tetanus vaccination status reviewed: {tetanus status:315746}. - HPV: {Blank single:19197::Up to date,Administered today,Not applicable,Refused,Given elsewhere} - Influenza: Postponed to flu season - Prevnar 20: Not applicable - Zostavax (50+): Not applicable   Past medical history, surgical history, medications, allergies, family history and social history reviewed with patient today and changes made to appropriate areas of the chart.   Social History   Socioeconomic History   Marital status: Single    Spouse name: Not on file   Number of children: Not on file   Years of education: Not on file   Highest education level: GED or equivalent  Occupational History   Not on file  Tobacco Use   Smoking status: Former    Current packs/day: 2.00    Types: Cigarettes   Smokeless tobacco: Never  Vaping Use   Vaping status: Never Used  Substance and Sexual Activity   Alcohol use: Yes    Comment: occ   Drug use: Yes    Types: Marijuana    Comment: every day   Sexual activity: Yes    Birth control/protection: Condom    Comment: most of the times  Other Topics Concern   Not on file  Social History Narrative   Not on file   Social Drivers of Health   Financial Resource Strain: Medium Risk (08/26/2023)   Overall Financial Resource  Strain (CARDIA)    Difficulty of Paying Living Expenses: Somewhat hard  Food Insecurity: Food Insecurity Present (08/26/2023)   Hunger Vital Sign    Worried About Running Out of Food in the Last Year: Sometimes true    Ran Out of Food in the Last Year: Sometimes true  Transportation Needs: Unmet Transportation Needs (08/26/2023)   PRAPARE - Administrator, Civil Service (Medical): Yes    Lack of Transportation (Non-Medical): Yes  Physical Activity: Insufficiently Active (08/26/2023)   Exercise Vital Sign    Days of Exercise per Week: 3 days    Minutes of Exercise per Session: 10 min  Stress: Not on file  Social Connections: Moderately Isolated (08/26/2023)   Social Connection and Isolation Panel    Frequency of Communication with Friends and Family: Twice a week    Frequency of Social Gatherings with Friends and Family: Three times a week    Attends Religious Services: Never    Active Member of Clubs or Organizations: No    Attends Banker Meetings: Not on file    Marital Status: Living with partner  Intimate Partner Violence: Not on file     Past Medical History:  Diagnosis Date   Chlamydia 08/02/2019   +08/02/2019  TOC in 4-6 weeks      Past Surgical History:  Procedure Laterality Date   TOOTH EXTRACTION      Current Outpatient Medications on File Prior to Visit  Medication Sig  amoxicillin -clavulanate (AUGMENTIN ) 875-125 MG tablet Take 1 tablet by mouth 2 (two) times daily.   naproxen  (NAPROSYN ) 500 MG tablet Take 1 tablet (500 mg total) by mouth 2 (two) times daily with a meal.   No current facility-administered medications on file prior to visit.    No Known Allergies  Family History  Problem Relation Age of Onset   Healthy Mother    Healthy Father      ROS: Denies fever, fatigue, unexplained weight loss/gain, hearing or vision changes, cardiac or respiratory complaints. Denies neurological deficits, musculoskeletal complaints,  gastrointestinal or genitourinary complaints, mental health complaints, and skin changes.   Objective:   There were no vitals filed for this visit.  GENERAL APPEARANCE: Well-appearing, in NAD. Well nourished.  SKIN: Pink, warm and dry. Turgor normal. No rash, lesion, ulceration, or ecchymoses. Hair evenly distributed.  HEENT: HEAD: Normocephalic.  EYES: PERRLA. EOMI. Lids intact w/o defect. Sclera white, Conjunctiva pink w/o exudate.  EARS: External ear w/o redness, swelling, masses or lesions. EAC clear. TM's intact, translucent w/o bulging, appropriate landmarks visualized. Appropriate acuity to conversational tones.  NOSE: Septum midline w/o deformity. Nares patent, mucosa pink and non-inflamed w/o drainage.  THROAT: Uvula midline. Oropharynx clear. Tonsils non-inflamed w/o exudate ***. Oral mucosa pink and moist.  NECK: Supple, Trachea midline. Full ROM w/o pain or tenderness. No lymphadenopathy. Thyroid non-tender w/o enlargement or palpable masses.  BREASTS: Breasts pendulous, symmetrical, and w/o palpable masses. Nipples everted and w/o discharge. No rash or skin retraction. No axillary or supraclavicular lymphadenopathy.  RESPIRATORY: Chest wall symmetrical w/o masses. Respirations even and non-labored. Breath sounds clear to auscultation bilaterally. No wheezes, rales, rhonchi, or crackles. CARDIAC: S1, S2 present, regular rate and rhythm. No gallops, murmurs, rubs, or clicks. Capillary refill <2 seconds. Peripheral pulses 2+ bilaterally. GI: Abdomen soft w/o distention. Normoactive bowel sounds. No palpable masses or tenderness. No guarding or rebound tenderness. Liver and spleen w/o tenderness or enlargement. No CVA tenderness.  GU: *** External genitalia without erythema, lesions, or masses. No lymphadenopathy. Vaginal mucosa pink and moist without exudate, lesions, or ulcerations. Cervix pink without discharge. Cervical os closed. Uterus and adnexae palpable, not enlarged, and w/o  tenderness. No palpable masses.  MSK: Muscle tone and strength appropriate for age, w/o atrophy or abnormal movement.  EXTREMITIES: Active ROM intact, w/o tenderness, crepitus, or contracture. No obvious joint deformities or effusions. No clubbing, edema, or cyanosis.  NEUROLOGIC: CN's II-XII intact. Motor strength symmetrical with no obvious weakness. No sensory deficits. Steady, even gait.  PSYCH/MENTAL STATUS: Alert, oriented x 3. Cooperative, appropriate mood and affect.   Chaperoned by Angelyn Hollingsworth CMA ***  Depression and Anxiety Screen done today and results listed below:     08/30/2023    8:35 AM 08/03/2018   11:59 AM  Depression screen PHQ 2/9  Decreased Interest 0 0  Down, Depressed, Hopeless 0 0  PHQ - 2 Score 0 0  Altered sleeping 0 0  Tired, decreased energy 0 0  Change in appetite 0 0  Feeling bad or failure about yourself  0 0  Trouble concentrating 0 0  Moving slowly or fidgety/restless 0 0  Suicidal thoughts 0 0  PHQ-9 Score 0 0  Difficult doing work/chores Not difficult at all       08/30/2023    8:35 AM 08/03/2018   11:59 AM  GAD 7 : Generalized Anxiety Score  Nervous, Anxious, on Edge 0 0  Control/stop worrying 0 0  Worry too much - different things 0  0  Trouble relaxing 0 0  Restless 0 0  Easily annoyed or irritable 0 0  Afraid - awful might happen 0 0  Total GAD 7 Score 0 0  Anxiety Difficulty Not difficult at all     Assessment & Plan:  There are no diagnoses linked to this encounter.  No orders of the defined types were placed in this encounter.   PATIENT COUNSELING:  - Encouraged a healthy well-balanced diet. Patient may adjust caloric intake to maintain or achieve ideal body weight. May reduce intake of dietary saturated fat and total fat and have adequate dietary potassium and calcium preferably from fresh fruits, vegetables, and low-fat dairy products.   - Advised to avoid cigarette smoking. - Discussed with the patient that most people  either abstain from alcohol or drink within safe limits (<=14/week and <=4 drinks/occasion for males, <=7/weeks and <= 3 drinks/occasion for females) and that the risk for alcohol disorders and other health effects rises proportionally with the number of drinks per week and how often a drinker exceeds daily limits. - Discussed cessation/primary prevention of drug use and availability of treatment for abuse.  - Discussed sexually transmitted diseases, avoidance of unintended pregnancy and contraceptive alternatives.  - Stressed the importance of regular exercise - Injury prevention: Discussed safety belts, safety helmets, smoke detector, smoking near bedding or upholstery.  - Dental health: Discussed importance of regular tooth brushing, flossing, and dental visits.   NEXT PREVENTATIVE PHYSICAL DUE IN 1 YEAR.  No follow-ups on file.  Rosina Senters, FNP

## 2024-05-16 ENCOUNTER — Telehealth: Payer: Self-pay | Admitting: Internal Medicine

## 2024-05-16 NOTE — Telephone Encounter (Signed)
 04/16/2024 no show 05/14/2024 no show  Final warning sent via mail and mychart

## 2024-05-16 NOTE — Telephone Encounter (Signed)
 Provider aware.
